# Patient Record
Sex: Male | Born: 1955 | Race: White | Hispanic: No | Marital: Married | Smoking: Former smoker
Health system: Southern US, Community
[De-identification: ages and names within clinical notes are randomized; demographics above are authoritative.]

## PROBLEM LIST (undated history)

## (undated) DIAGNOSIS — C801 Malignant (primary) neoplasm, unspecified: Secondary | ICD-10-CM

## (undated) DIAGNOSIS — I10 Essential (primary) hypertension: Secondary | ICD-10-CM

## (undated) DIAGNOSIS — Z8639 Personal history of other endocrine, nutritional and metabolic disease: Secondary | ICD-10-CM

---

## 2014-07-17 ENCOUNTER — Ambulatory Visit: Payer: Self-pay | Admitting: Physician Assistant

## 2014-07-21 ENCOUNTER — Ambulatory Visit: Payer: Self-pay | Admitting: Internal Medicine

## 2014-07-21 LAB — COMPREHENSIVE METABOLIC PANEL
ALBUMIN: 4.3 g/dL (ref 3.4–5.0)
Alkaline Phosphatase: 76 U/L
Anion Gap: 6 — ABNORMAL LOW (ref 7–16)
BILIRUBIN TOTAL: 0.7 mg/dL (ref 0.2–1.0)
BUN: 14 mg/dL (ref 7–18)
CHLORIDE: 99 mmol/L (ref 98–107)
CO2: 32 mmol/L (ref 21–32)
Calcium, Total: 9.6 mg/dL (ref 8.5–10.1)
Creatinine: 1.27 mg/dL (ref 0.60–1.30)
EGFR (Non-African Amer.): >60
Glucose: 92 mg/dL (ref 65–99)
Osmolality: 274 (ref 275–301)
Potassium: 4.9 mmol/L (ref 3.5–5.1)
SGOT(AST): 32 U/L (ref 15–37)
SGPT (ALT): 55 U/L
Sodium: 137 mmol/L (ref 136–145)
TOTAL PROTEIN: 7.9 g/dL (ref 6.4–8.2)

## 2014-07-21 LAB — CBC CANCER CENTER
BASOS ABS: 0.1 x10 3/mm (ref 0.0–0.1)
Basophil %: 0.8 %
EOS ABS: 0.2 x10 3/mm (ref 0.0–0.7)
Eosinophil %: 2.9 %
HCT: 50.3 % (ref 40.0–52.0)
HGB: 17.1 g/dL (ref 13.0–18.0)
Lymphocyte #: 2 x10 3/mm (ref 1.0–3.6)
Lymphocyte %: 29.4 %
MCH: 30.5 pg (ref 26.0–34.0)
MCHC: 33.9 g/dL (ref 32.0–36.0)
MCV: 90 fL (ref 80–100)
MONO ABS: 0.7 x10 3/mm (ref 0.2–1.0)
Monocyte %: 9.9 %
Neutrophil #: 3.9 x10 3/mm (ref 1.4–6.5)
Neutrophil %: 57 %
PLATELETS: 222 x10 3/mm (ref 150–440)
RBC: 5.61 10*6/uL (ref 4.40–5.90)
RDW: 13.2 % (ref 11.5–14.5)
WBC: 6.9 x10 3/mm (ref 3.8–10.6)

## 2014-07-21 LAB — APTT: ACTIVATED PTT: 25.9 s (ref 23.6–35.9)

## 2014-07-21 LAB — PROTIME-INR
INR: 1.2
Prothrombin Time: 15.2 secs — ABNORMAL HIGH (ref 11.5–14.7)

## 2014-07-28 ENCOUNTER — Ambulatory Visit: Payer: Self-pay | Admitting: Internal Medicine

## 2014-08-05 ENCOUNTER — Ambulatory Visit: Payer: Self-pay | Admitting: Vascular Surgery

## 2014-08-10 LAB — HEPATIC FUNCTION PANEL A (ARMC)
ALK PHOS: 77 U/L
Albumin: 4.1 g/dL (ref 3.4–5.0)
BILIRUBIN DIRECT: 0.1 mg/dL (ref 0.0–0.2)
BILIRUBIN TOTAL: 0.6 mg/dL (ref 0.2–1.0)
SGOT(AST): 22 U/L (ref 15–37)
SGPT (ALT): 44 U/L
Total Protein: 7.7 g/dL (ref 6.4–8.2)

## 2014-08-10 LAB — BASIC METABOLIC PANEL
Anion Gap: 12 (ref 7–16)
BUN: 19 mg/dL — ABNORMAL HIGH (ref 7–18)
CREATININE: 1.16 mg/dL (ref 0.60–1.30)
Calcium, Total: 9.3 mg/dL (ref 8.5–10.1)
Chloride: 100 mmol/L (ref 98–107)
Co2: 25 mmol/L (ref 21–32)
Glucose: 162 mg/dL — ABNORMAL HIGH (ref 65–99)
Osmolality: 280 (ref 275–301)
POTASSIUM: 4.3 mmol/L (ref 3.5–5.1)
Sodium: 137 mmol/L (ref 136–145)

## 2014-08-10 LAB — CBC CANCER CENTER
Basophil #: 0 x10 3/mm (ref 0.0–0.1)
Basophil %: 0.1 %
EOS ABS: 0 x10 3/mm (ref 0.0–0.7)
Eosinophil %: 0 %
HCT: 47.9 % (ref 40.0–52.0)
HGB: 16.2 g/dL (ref 13.0–18.0)
Lymphocyte #: 1 x10 3/mm (ref 1.0–3.6)
Lymphocyte %: 6.3 %
MCH: 29.7 pg (ref 26.0–34.0)
MCHC: 33.9 g/dL (ref 32.0–36.0)
MCV: 88 fL (ref 80–100)
Monocyte #: 0.3 x10 3/mm (ref 0.2–1.0)
Monocyte %: 2.1 %
Neutrophil #: 14.4 x10 3/mm — ABNORMAL HIGH (ref 1.4–6.5)
Neutrophil %: 91.5 %
Platelet: 229 x10 3/mm (ref 150–440)
RBC: 5.46 10*6/uL (ref 4.40–5.90)
RDW: 13.3 % (ref 11.5–14.5)
WBC: 15.8 x10 3/mm — ABNORMAL HIGH (ref 3.8–10.6)

## 2014-08-11 ENCOUNTER — Ambulatory Visit: Payer: Self-pay | Admitting: Internal Medicine

## 2014-08-17 LAB — CBC CANCER CENTER
BASOS PCT: 0.5 %
Basophil #: 0 x10 3/mm (ref 0.0–0.1)
Eosinophil #: 0 x10 3/mm (ref 0.0–0.7)
Eosinophil %: 2.8 %
HCT: 44.7 % (ref 40.0–52.0)
HGB: 15.1 g/dL (ref 13.0–18.0)
LYMPHS ABS: 1.1 x10 3/mm (ref 1.0–3.6)
Lymphocyte %: 90.3 %
MCH: 29.5 pg (ref 26.0–34.0)
MCHC: 33.8 g/dL (ref 32.0–36.0)
MCV: 87 fL (ref 80–100)
MONO ABS: 0.1 x10 3/mm — AB (ref 0.2–1.0)
MONOS PCT: 5.4 %
Neutrophil #: 0 x10 3/mm — ABNORMAL LOW (ref 1.4–6.5)
Neutrophil %: 1 %
Platelet: 79 x10 3/mm — ABNORMAL LOW (ref 150–440)
RBC: 5.12 10*6/uL (ref 4.40–5.90)
RDW: 12.8 % (ref 11.5–14.5)
WBC: 1.2 x10 3/mm — CL (ref 3.8–10.6)

## 2014-08-17 LAB — CREATININE, SERUM
Creatinine: 1.48 mg/dL — ABNORMAL HIGH (ref 0.60–1.30)
EGFR (Non-African Amer.): 52 — ABNORMAL LOW

## 2014-08-20 ENCOUNTER — Inpatient Hospital Stay: Payer: Self-pay | Admitting: Internal Medicine

## 2014-08-20 LAB — CBC WITH DIFFERENTIAL/PLATELET
Bands: 6 %
COMMENT - H1-COM1: NORMAL
EOS PCT: 2 %
HCT: 42.7 % (ref 40.0–52.0)
HGB: 14.2 g/dL (ref 13.0–18.0)
Lymphocytes: 45 %
MCH: 30 pg (ref 26.0–34.0)
MCHC: 33.2 g/dL (ref 32.0–36.0)
MCV: 90 fL (ref 80–100)
MONOS PCT: 10 %
Metamyelocyte: 1 %
Platelet: 54 10*3/uL — ABNORMAL LOW (ref 150–440)
RBC: 4.74 10*6/uL (ref 4.40–5.90)
RDW: 13 % (ref 11.5–14.5)
Segmented Neutrophils: 26 %
Variant Lymphocyte - H1-Rlymph: 10 %
WBC: 2.3 10*3/uL — AB (ref 3.8–10.6)

## 2014-08-20 LAB — COMPREHENSIVE METABOLIC PANEL
ALBUMIN: 3.4 g/dL (ref 3.4–5.0)
ANION GAP: 10 (ref 7–16)
AST: 20 U/L (ref 15–37)
Alkaline Phosphatase: 76 U/L
BUN: 23 mg/dL — AB (ref 7–18)
Bilirubin,Total: 0.6 mg/dL (ref 0.2–1.0)
CHLORIDE: 97 mmol/L — AB (ref 98–107)
CO2: 27 mmol/L (ref 21–32)
CREATININE: 1.53 mg/dL — AB (ref 0.60–1.30)
Calcium, Total: 8.7 mg/dL (ref 8.5–10.1)
EGFR (Non-African Amer.): 50 — ABNORMAL LOW
GLUCOSE: 85 mg/dL (ref 65–99)
Osmolality: 271 (ref 275–301)
Potassium: 3.8 mmol/L (ref 3.5–5.1)
SGPT (ALT): 22 U/L
Sodium: 134 mmol/L — ABNORMAL LOW (ref 136–145)
TOTAL PROTEIN: 7 g/dL (ref 6.4–8.2)

## 2014-08-21 LAB — BASIC METABOLIC PANEL
ANION GAP: 7 (ref 7–16)
BUN: 14 mg/dL (ref 7–18)
CHLORIDE: 109 mmol/L — AB (ref 98–107)
Calcium, Total: 7.9 mg/dL — ABNORMAL LOW (ref 8.5–10.1)
Co2: 24 mmol/L (ref 21–32)
Creatinine: 1.22 mg/dL (ref 0.60–1.30)
EGFR (African American): >60
EGFR (Non-African Amer.): >60
Glucose: 91 mg/dL (ref 65–99)
Osmolality: 279 (ref 275–301)
Potassium: 4 mmol/L (ref 3.5–5.1)
Sodium: 140 mmol/L (ref 136–145)

## 2014-08-21 LAB — CBC WITH DIFFERENTIAL/PLATELET
Basophil #: 0 10*3/uL (ref 0.0–0.1)
Basophil %: 0.5 %
Eosinophil #: 0 10*3/uL (ref 0.0–0.7)
Eosinophil %: 0.6 %
HCT: 38.1 % — AB (ref 40.0–52.0)
HGB: 12.7 g/dL — ABNORMAL LOW (ref 13.0–18.0)
LYMPHS PCT: 29.1 %
Lymphocyte #: 1 10*3/uL (ref 1.0–3.6)
MCH: 30.3 pg (ref 26.0–34.0)
MCHC: 33.3 g/dL (ref 32.0–36.0)
MCV: 91 fL (ref 80–100)
MONOS PCT: 10.8 %
Monocyte #: 0.4 x10 3/mm (ref 0.2–1.0)
NEUTROS ABS: 2.1 10*3/uL (ref 1.4–6.5)
Neutrophil %: 59 %
Platelet: 66 10*3/uL — ABNORMAL LOW (ref 150–440)
RBC: 4.2 10*6/uL — ABNORMAL LOW (ref 4.40–5.90)
RDW: 12.8 % (ref 11.5–14.5)
WBC: 3.6 10*3/uL — ABNORMAL LOW (ref 3.8–10.6)

## 2014-08-24 LAB — CULTURE, BLOOD (SINGLE)

## 2014-08-25 LAB — CBC CANCER CENTER
BASOS ABS: 0.1 x10 3/mm (ref 0.0–0.1)
Basophil %: 0.4 %
EOS ABS: 0 x10 3/mm (ref 0.0–0.7)
Eosinophil %: 0.3 %
HCT: 42.2 % (ref 40.0–52.0)
HGB: 14.1 g/dL (ref 13.0–18.0)
Lymphocyte #: 2.2 x10 3/mm (ref 1.0–3.6)
Lymphocyte %: 13.1 %
MCH: 29.3 pg (ref 26.0–34.0)
MCHC: 33.3 g/dL (ref 32.0–36.0)
MCV: 88 fL (ref 80–100)
MONOS PCT: 6.2 %
Monocyte #: 1 x10 3/mm (ref 0.2–1.0)
NEUTROS PCT: 80 %
Neutrophil #: 13.4 x10 3/mm — ABNORMAL HIGH (ref 1.4–6.5)
Platelet: 213 x10 3/mm (ref 150–440)
RBC: 4.8 10*6/uL (ref 4.40–5.90)
RDW: 13 % (ref 11.5–14.5)
WBC: 16.8 x10 3/mm — AB (ref 3.8–10.6)

## 2014-08-25 LAB — HEPATIC FUNCTION PANEL A (ARMC)
Albumin: 3.6 g/dL (ref 3.4–5.0)
Alkaline Phosphatase: 86 U/L
Bilirubin, Direct: 0.1 mg/dL (ref 0.0–0.2)
Bilirubin,Total: 0.2 mg/dL (ref 0.2–1.0)
SGOT(AST): 22 U/L (ref 15–37)
SGPT (ALT): 31 U/L
Total Protein: 6.4 g/dL (ref 6.4–8.2)

## 2014-08-25 LAB — BASIC METABOLIC PANEL
Anion Gap: 11 (ref 7–16)
BUN: 17 mg/dL (ref 7–18)
Calcium, Total: 8.1 mg/dL — ABNORMAL LOW (ref 8.5–10.1)
Chloride: 103 mmol/L (ref 98–107)
Co2: 25 mmol/L (ref 21–32)
Creatinine: 1.44 mg/dL — ABNORMAL HIGH (ref 0.60–1.30)
EGFR (African American): >60
EGFR (Non-African Amer.): 54 — ABNORMAL LOW
Glucose: 84 mg/dL (ref 65–99)
Osmolality: 278 (ref 275–301)
Potassium: 3.7 mmol/L (ref 3.5–5.1)
Sodium: 139 mmol/L (ref 136–145)

## 2014-08-25 LAB — MAGNESIUM: MAGNESIUM: 1.5 mg/dL — AB

## 2014-09-07 LAB — CBC CANCER CENTER
Basophil #: 0 x10 3/mm (ref 0.0–0.1)
Basophil %: 0.2 %
Eosinophil #: 0 x10 3/mm (ref 0.0–0.7)
Eosinophil %: 0 %
HCT: 41.9 % (ref 40.0–52.0)
HGB: 13.7 g/dL (ref 13.0–18.0)
LYMPHS ABS: 1.2 x10 3/mm (ref 1.0–3.6)
Lymphocyte %: 5.3 %
MCH: 29.3 pg (ref 26.0–34.0)
MCHC: 32.8 g/dL (ref 32.0–36.0)
MCV: 89 fL (ref 80–100)
Monocyte #: 0.6 x10 3/mm (ref 0.2–1.0)
Monocyte %: 2.6 %
NEUTROS ABS: 21.2 x10 3/mm — AB (ref 1.4–6.5)
NEUTROS PCT: 91.9 %
PLATELETS: 405 x10 3/mm (ref 150–440)
RBC: 4.69 10*6/uL (ref 4.40–5.90)
RDW: 14.1 % (ref 11.5–14.5)
WBC: 23.1 x10 3/mm — ABNORMAL HIGH (ref 3.8–10.6)

## 2014-09-07 LAB — BASIC METABOLIC PANEL
ANION GAP: 12 (ref 7–16)
BUN: 17 mg/dL (ref 7–18)
CO2: 24 mmol/L (ref 21–32)
Calcium, Total: 8.8 mg/dL (ref 8.5–10.1)
Chloride: 97 mmol/L — ABNORMAL LOW (ref 98–107)
Creatinine: 1.33 mg/dL — ABNORMAL HIGH (ref 0.60–1.30)
EGFR (African American): >60
EGFR (Non-African Amer.): 59 — ABNORMAL LOW
GLUCOSE: 208 mg/dL — AB (ref 65–99)
Osmolality: 274 (ref 275–301)
Potassium: 4.6 mmol/L (ref 3.5–5.1)
SODIUM: 133 mmol/L — AB (ref 136–145)

## 2014-09-11 ENCOUNTER — Ambulatory Visit: Payer: Self-pay | Admitting: Internal Medicine

## 2014-09-14 LAB — CBC CANCER CENTER
BASOS ABS: 0 x10 3/mm (ref 0.0–0.1)
BASOS PCT: 0.9 %
Eosinophil #: 0.3 x10 3/mm (ref 0.0–0.7)
Eosinophil %: 10.4 %
HCT: 41.2 % (ref 40.0–52.0)
HGB: 13.8 g/dL (ref 13.0–18.0)
LYMPHS ABS: 1.4 x10 3/mm (ref 1.0–3.6)
Lymphocyte %: 51.5 %
MCH: 29.3 pg (ref 26.0–34.0)
MCHC: 33.4 g/dL (ref 32.0–36.0)
MCV: 88 fL (ref 80–100)
MONO ABS: 0.5 x10 3/mm (ref 0.2–1.0)
Monocyte %: 18.3 %
NEUTROS ABS: 0.5 x10 3/mm — AB (ref 1.4–6.5)
Neutrophil %: 18.9 %
Platelet: 112 x10 3/mm — ABNORMAL LOW (ref 150–440)
RBC: 4.69 10*6/uL (ref 4.40–5.90)
RDW: 13.7 % (ref 11.5–14.5)
WBC: 2.8 x10 3/mm — ABNORMAL LOW (ref 3.8–10.6)

## 2014-09-14 LAB — CREATININE, SERUM
CREATININE: 1.27 mg/dL (ref 0.60–1.30)
EGFR (African American): >60

## 2014-09-21 LAB — HEPATIC FUNCTION PANEL A (ARMC)
Albumin: 3.5 g/dL (ref 3.4–5.0)
Alkaline Phosphatase: 104 U/L
Bilirubin,Total: 0.3 mg/dL (ref 0.2–1.0)
SGOT(AST): 34 U/L (ref 15–37)
SGPT (ALT): 36 U/L
Total Protein: 6.3 g/dL — ABNORMAL LOW (ref 6.4–8.2)

## 2014-09-21 LAB — CBC CANCER CENTER
BASOS ABS: 0.1 x10 3/mm (ref 0.0–0.1)
BASOS PCT: 0.9 %
Eosinophil #: 0 x10 3/mm (ref 0.0–0.7)
Eosinophil %: 0.3 %
HCT: 42.9 % (ref 40.0–52.0)
HGB: 13.9 g/dL (ref 13.0–18.0)
LYMPHS PCT: 11.5 %
Lymphocyte #: 1.7 x10 3/mm (ref 1.0–3.6)
MCH: 29.8 pg (ref 26.0–34.0)
MCHC: 32.4 g/dL (ref 32.0–36.0)
MCV: 92 fL (ref 80–100)
MONOS PCT: 5 %
Monocyte #: 0.7 x10 3/mm (ref 0.2–1.0)
Neutrophil #: 11.9 x10 3/mm — ABNORMAL HIGH (ref 1.4–6.5)
Neutrophil %: 82.3 %
Platelet: 95 x10 3/mm — ABNORMAL LOW (ref 150–440)
RBC: 4.67 10*6/uL (ref 4.40–5.90)
RDW: 14.7 % — AB (ref 11.5–14.5)
WBC: 14.5 x10 3/mm — ABNORMAL HIGH (ref 3.8–10.6)

## 2014-09-21 LAB — BASIC METABOLIC PANEL
Anion Gap: 7 (ref 7–16)
BUN: 17 mg/dL (ref 7–18)
Calcium, Total: 8.6 mg/dL (ref 8.5–10.1)
Chloride: 105 mmol/L (ref 98–107)
Co2: 26 mmol/L (ref 21–32)
Creatinine: 1.11 mg/dL (ref 0.60–1.30)
EGFR (African American): >60
EGFR (Non-African Amer.): >60
Glucose: 101 mg/dL — ABNORMAL HIGH (ref 65–99)
Osmolality: 277 (ref 275–301)
Potassium: 4.8 mmol/L (ref 3.5–5.1)
Sodium: 138 mmol/L (ref 136–145)

## 2014-09-21 LAB — MAGNESIUM: Magnesium: 1.5 mg/dL — ABNORMAL LOW

## 2014-09-28 LAB — BASIC METABOLIC PANEL
ANION GAP: 10 (ref 7–16)
BUN: 22 mg/dL — AB (ref 7–18)
Calcium, Total: 8.6 mg/dL (ref 8.5–10.1)
Chloride: 100 mmol/L (ref 98–107)
Co2: 24 mmol/L (ref 21–32)
Creatinine: 1.24 mg/dL (ref 0.60–1.30)
EGFR (African American): >60
EGFR (Non-African Amer.): >60
Glucose: 127 mg/dL — ABNORMAL HIGH (ref 65–99)
Osmolality: 273 (ref 275–301)
Potassium: 4.3 mmol/L (ref 3.5–5.1)
SODIUM: 134 mmol/L — AB (ref 136–145)

## 2014-09-28 LAB — CBC CANCER CENTER
BASOS ABS: 0.1 x10 3/mm (ref 0.0–0.1)
BASOS PCT: 0.3 %
EOS ABS: 0 x10 3/mm (ref 0.0–0.7)
Eosinophil %: 0 %
HCT: 39.7 % — ABNORMAL LOW (ref 40.0–52.0)
HGB: 13 g/dL (ref 13.0–18.0)
LYMPHS PCT: 7.4 %
Lymphocyte #: 1.5 x10 3/mm (ref 1.0–3.6)
MCH: 29.4 pg (ref 26.0–34.0)
MCHC: 32.7 g/dL (ref 32.0–36.0)
MCV: 90 fL (ref 80–100)
Monocyte #: 0.9 x10 3/mm (ref 0.2–1.0)
Monocyte %: 4.4 %
Neutrophil #: 17.6 x10 3/mm — ABNORMAL HIGH (ref 1.4–6.5)
Neutrophil %: 87.9 %
Platelet: 238 x10 3/mm (ref 150–440)
RBC: 4.41 10*6/uL (ref 4.40–5.90)
RDW: 15.2 % — ABNORMAL HIGH (ref 11.5–14.5)
WBC: 20 x10 3/mm — AB (ref 3.8–10.6)

## 2014-10-05 LAB — COMPREHENSIVE METABOLIC PANEL
ALBUMIN: 3.7 g/dL (ref 3.4–5.0)
ALT: 25 U/L
AST: 13 U/L — AB (ref 15–37)
Alkaline Phosphatase: 74 U/L
Anion Gap: 9 (ref 7–16)
BILIRUBIN TOTAL: 0.7 mg/dL (ref 0.2–1.0)
BUN: 25 mg/dL — ABNORMAL HIGH (ref 7–18)
CREATININE: 1.36 mg/dL — AB (ref 0.60–1.30)
Calcium, Total: 8 mg/dL — ABNORMAL LOW (ref 8.5–10.1)
Chloride: 96 mmol/L — ABNORMAL LOW (ref 98–107)
Co2: 30 mmol/L (ref 21–32)
EGFR (African American): >60
EGFR (Non-African Amer.): 57 — ABNORMAL LOW
GLUCOSE: 96 mg/dL (ref 65–99)
Osmolality: 274 (ref 275–301)
Potassium: 3.5 mmol/L (ref 3.5–5.1)
Sodium: 135 mmol/L — ABNORMAL LOW (ref 136–145)
TOTAL PROTEIN: 6.4 g/dL (ref 6.4–8.2)

## 2014-10-05 LAB — CBC CANCER CENTER
Basophil #: 0 x10 3/mm (ref 0.0–0.1)
Basophil %: 1.1 %
Eosinophil #: 0.1 x10 3/mm (ref 0.0–0.7)
Eosinophil %: 3.8 %
HCT: 38.3 % — ABNORMAL LOW (ref 40.0–52.0)
HGB: 12.8 g/dL — ABNORMAL LOW (ref 13.0–18.0)
Lymphocyte #: 1.3 x10 3/mm (ref 1.0–3.6)
Lymphocyte %: 52.9 %
MCH: 29.7 pg (ref 26.0–34.0)
MCHC: 33.4 g/dL (ref 32.0–36.0)
MCV: 89 fL (ref 80–100)
Monocyte #: 0.3 x10 3/mm (ref 0.2–1.0)
Monocyte %: 13.2 %
NEUTROS ABS: 0.7 x10 3/mm — AB (ref 1.4–6.5)
NEUTROS PCT: 29 %
Platelet: 80 x10 3/mm — ABNORMAL LOW (ref 150–440)
RBC: 4.31 10*6/uL — ABNORMAL LOW (ref 4.40–5.90)
RDW: 15.1 % — AB (ref 11.5–14.5)
WBC: 2.4 x10 3/mm — ABNORMAL LOW (ref 3.8–10.6)

## 2014-10-12 ENCOUNTER — Ambulatory Visit: Payer: Self-pay | Admitting: Internal Medicine

## 2014-10-12 LAB — CBC CANCER CENTER
Basophil #: 0.1 x10 3/mm (ref 0.0–0.1)
Basophil %: 0.8 %
EOS ABS: 0 x10 3/mm (ref 0.0–0.7)
Eosinophil %: 0.5 %
HCT: 39 % — ABNORMAL LOW (ref 40.0–52.0)
HGB: 13.1 g/dL (ref 13.0–18.0)
Lymphocyte #: 1.3 x10 3/mm (ref 1.0–3.6)
Lymphocyte %: 18.4 %
MCH: 30.7 pg (ref 26.0–34.0)
MCHC: 33.6 g/dL (ref 32.0–36.0)
MCV: 91 fL (ref 80–100)
MONO ABS: 0.3 x10 3/mm (ref 0.2–1.0)
Monocyte %: 4.8 %
NEUTROS PCT: 75.5 %
Neutrophil #: 5.2 x10 3/mm (ref 1.4–6.5)
PLATELETS: 105 x10 3/mm — AB (ref 150–440)
RBC: 4.27 10*6/uL — ABNORMAL LOW (ref 4.40–5.90)
RDW: 16.5 % — AB (ref 11.5–14.5)
WBC: 6.9 x10 3/mm (ref 3.8–10.6)

## 2014-10-12 LAB — COMPREHENSIVE METABOLIC PANEL
ANION GAP: 9 (ref 7–16)
AST: 15 U/L (ref 15–37)
Albumin: 3.5 g/dL (ref 3.4–5.0)
Alkaline Phosphatase: 86 U/L (ref 46–116)
BUN: 11 mg/dL (ref 7–18)
Bilirubin,Total: 0.3 mg/dL (ref 0.2–1.0)
CHLORIDE: 102 mmol/L (ref 98–107)
Calcium, Total: 8.4 mg/dL — ABNORMAL LOW (ref 8.5–10.1)
Co2: 28 mmol/L (ref 21–32)
Creatinine: 1.13 mg/dL (ref 0.60–1.30)
EGFR (Non-African Amer.): >60
Glucose: 115 mg/dL — ABNORMAL HIGH (ref 65–99)
Osmolality: 278 (ref 275–301)
Potassium: 4.4 mmol/L (ref 3.5–5.1)
SGPT (ALT): 23 U/L (ref 14–63)
Sodium: 139 mmol/L (ref 136–145)
TOTAL PROTEIN: 5.9 g/dL — AB (ref 6.4–8.2)

## 2014-11-10 ENCOUNTER — Ambulatory Visit
Admit: 2014-11-10 | Disposition: A | Discharge status: Home / Self Care | Payer: Self-pay | Attending: Internal Medicine | Admitting: Internal Medicine

## 2014-12-09 LAB — CBC CANCER CENTER
Basophil #: 0 x10 3/mm (ref 0.0–0.1)
Basophil %: 0.4 %
EOS ABS: 0.1 x10 3/mm (ref 0.0–0.7)
EOS PCT: 3.5 %
HCT: 39.4 % — AB (ref 40.0–52.0)
HGB: 13.4 g/dL (ref 13.0–18.0)
LYMPHS ABS: 0.6 x10 3/mm — AB (ref 1.0–3.6)
Lymphocyte %: 18.2 %
MCH: 30.6 pg (ref 26.0–34.0)
MCHC: 34.1 g/dL (ref 32.0–36.0)
MCV: 90 fL (ref 80–100)
MONOS PCT: 12.4 %
Monocyte #: 0.4 x10 3/mm (ref 0.2–1.0)
NEUTROS ABS: 2.1 x10 3/mm (ref 1.4–6.5)
Neutrophil %: 65.5 %
Platelet: 136 x10 3/mm — ABNORMAL LOW (ref 150–440)
RBC: 4.39 10*6/uL — AB (ref 4.40–5.90)
RDW: 13.7 % (ref 11.5–14.5)
WBC: 3.2 x10 3/mm — AB (ref 3.8–10.6)

## 2014-12-11 ENCOUNTER — Ambulatory Visit
Admit: 2014-12-11 | Disposition: A | Discharge status: Home / Self Care | Payer: Self-pay | Attending: Internal Medicine | Admitting: Internal Medicine

## 2015-01-02 NOTE — Discharge Summary (Signed)
PATIENT NAME:  Bryan Larson, Bryan Larson MR#:  [id number] DATE OF BIRTH:  1956-04-28  DATE OF ADMISSION:  08/20/2014 DATE OF DISCHARGE:  08/21/2014  PRIMARY ONCOLOGIST: Rhett Bannister. Ma Hillock, MD.  ADMITTING DIAGNOSES:  1. Dehydration.  2. Acute kidney injury.  3. Oral thrush and mucositis. 4. Chronic right tonsillar carcinoma.  5. Leukopenia.   DISCHARGE DIAGNOSES: 1. Dehydration is improved with intravenous fluids. 2. Acute kidney injury is resolved with intravenous fluids.  3. Oral thrush. Continue nystatin swish and swallow.  4. Chronic right tonsillar carcinoma Follow up with Dr. Ma Hillock as scheduled on 12/21.  5. Leukopenia, better. 6. Thrombocytopenia.  7. Hypotension. We will hold off on Diovan until tomorrow and resume spironolactone.  8. Paroxysmal atrial fibrillation, rate controlled. Continue atenolol. Not on any anticoagulants.   CONSULTATIONS: None.   PROCEDURES: None.   BRIEF HISTORY AND PHYSICAL AND HOSPITAL COURSE: The patient is a 59 year old Caucasian male who came into the ED as he was having a hard time to swallow with decreased p.o. intake from oral ulcers. The patient's blood pressure was at 104/80 in the ED. Creatinine was at 1.55, and the white blood cell count was at 2.3. Please review history and physical for details.   HOSPITAL COURSE: 1. The patient's dehydration seemed to be from poor p.o. intake from oral ulcers. He was given IV fluids. Dehydration was resolved. The patient was maintained on neutropenia, isolation precautions as his white count is low.  2. Acute kidney injury from dehydration, which was prerenal, improved with IV fluids.  3. Oral thrush with mucositis. The patient has right tonsillar squamous cell carcinoma, and his first dose of chemotherapy was on November 30. Thrush and mucositis seemed to be from chemotherapy. The patient was given nystatin swish and swallow and Magic mouthwash, and his clinical situation significantly improved, and he started  tolerating diet. He denies any pain when swallowing, clinically feeling better. His next appointment with Dr. Ma Hillock is on December 21.  4. Leukopenia. White count was 2.3 at the time of admission: Today it is better at 3.6. The patient was afebrile during the hospital course. The primary care physician and oncologist to monitor his white count. At this point, his white count is nicely improving. 5. Right tonsillar carcinoma. The patient is placed on prophylaxis with ciprofloxacin by Dr. Ma Hillock. The patient has to continue that.  6. Thrombocytopenia. Initial platelet count was at around 55,000 but today it is at 66,000. No bleeding diaphysis was noticed. His platelet count needs to be monitored closely.  7. Hypotension. Blood pressure is being low. Spironolactone and Diovan were held during the hospital course. Today, blood pressure is better. We will resume his spironolactone and recommended the patient to start taking Diovan from tomorrow.  8. History of paroxysmal atrial fibrillation, rate controlled, not on any anticoagulation. Continue atenolol.  9. Hyperlipidemia. Continue Simvastatin.   ACTIVITY: As tolerated.  PHYSICAL EXAMINATION: VITAL SIGNS: Today, temperature 98.4, pulse of 106, respirations 20, blood pressure 109/74, pulse oximetry 98% on room air.  GENERAL APPEARANCE: Not in acute distress. Moderately built and nourished.  HEENT: Normocephalic, atraumatic. Pupils are equally reactive to light and accommodation. No scleral icterus. No conjunctival injection. No sinus tenderness. No postnasal drip. Moist mucous membranes. Moist mucous membranes. Oropharynx with white patches. NECK: Supple. No JVD. No thyromegaly. Range of motion is intact.  LUNGS: Clear to auscultation bilaterally. No accessory muscle use and no anterior chest wall tenderness on palpation.  CARDIOVASCULAR: Irregularly irregular. No murmurs.  GASTROINTESTINAL: Soft. Bowel  sounds are positive in all 4 quadrants. Nontender,  nondistended. No hepatosplenomegaly. No masses felt.  NEUROLOGIC: Awake, alert and oriented x 3. Cranial nerves II through XII are grossly intact. Motor and sensory are intact. Reflexes 2+.  EXTREMITIES: No edema. No cyanosis. No clubbing.  PSYCHIATRIC: Awake, alert and oriented x 3. Normal mood and normal.   SIGNIFICANT LABORATORIES AND IMAGING STUDIES: The patient's creatinine at the time of admission is 1.53. Today it is at 1.22. BUN is normal. Sodium and potassium are normal. Anion gap is 7. GFR is greater than 60. LFTs are normal. WBC 3.6, hemoglobin 12.7, hematocrit 38.1, platelet count trended up from 54,000 to 66,000. Blood cultures have revealed no growth in 24 hours.  FOLLOWUP APPOINTMENTS: Follow up with primary care physician in a week, Dr. Ma Hillock as scheduled on December 21.  DIET: Regular diet. Supplement with Ensure Plus as recommended by dietitian.  MEDICATIONS AT THE TIME OF DISCHARGE: Diovan 40 mg p.o. once daily, start taking from tomorrow, 08/22/2014. Atenolol 100 mg p.o. once daily, spironolactone 25 mg p.o. once daily, Simvastatin 40 mg p.o. at bedtime, Compazine 10 mg 1 tablet p.o. every 6 hours as needed for nausea, ciprofloxacin 500 mg p.o. b.i.d. as recommended by Dr. Ma Hillock for prophylaxis as reported by the patient, Nexium 40 mg once daily, lidocaine topical gel apply to affected area once daily, nystatin swish and swallow 100,000 units per mL take 5 mL p.o. every 6 hours for 1 week, Magic mouthwash 10 mL p.o. every 6 hours as needed for one week, Tylenol 325 mg 2 tablets every 4 hours as needed for mild pain or temperature greater than 100.4, Ensure Clear 200 mL p.o. b.i.d. for a week, senna 1 tablet p.o. 2 times a day as needed for constipation.  The diagnosis and plan of care were discussed in detail with the patient. He verbalized understanding of the plan.    ___________________________ Nicholes Mango, MD ag:jh D: 08/21/2014 11:34:53 ET T: 08/21/2014 19:12:00  ET JOB#: [id number]  cc: Nicholes Mango, MD, <Dictator> Miriam "Mimi" Carrie Mew, PA-C Sandeep R. Ma Hillock, MD Nicholes Mango MD ELECTRONICALLY SIGNED 08/27/2014 17:04

## 2015-01-02 NOTE — H&P (Signed)
PATIENT NAME:  Bryan Larson, Bryan Larson MR#:  [id number] DATE OF BIRTH:  June 12, 1956  DATE OF ADMISSION:  08/20/2014  REFERRING PHYSICIAN: Valli Glance. Owens Shark, MD   PRIMARY CARE PROVIDER: Prentiss Bells "Mimi" Carrie Mew, PA-C  ADMITTING PHYSICIAN: Juluis Mire, MD   ONCOLOGIST: Rhett Bannister Ma Hillock, MD    CHIEF COMPLAINT:  1.  Decreased oral intake because of oral ulcers for the past 2-3 days.   2.  Low blood pressure on home monitoring.    HISTORY OF PRESENT ILLNESS: A 59 year old Caucasian male with past medical history significant for right tonsillar carcinoma diagnosed on 07/17/2014 recently started on chemotherapy on 08/10/2014 was brought to the Emergency Room with the complaints of  noticed low blood pressure on monitoring and has been having decreased oral intake for the past 2-3 days secondary due to oral ulcers. 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 He is currently under care of oncologist, Dr. Ma Hillock. He received  first chemotherapy on 08/10/2014 following which his  white count was very low and he is on neutropenic precautions at home. He was advised to monitor his blood pressure and temperature daily at home, and today, while he was having his blood pressure monitoring, he was noted to have a low blood pressure 85/65 at home but did not have any other complaints such as dizziness, fever, no chest pain. The patient admits to decreased oral intake for the past one week secondary to oral ulcers . Denies any fever. No chest pain. No shortness of breath. No nausea. No vomiting. No diarrhea. No abdominal pain. No urinary symptoms. No focal weakness or numbness. In the Emergency Room, the patient was evaluated by the ED physician and the blood pressure was 104/80 and workup revealed acute kidney injury with a creatinine of 1.55 and also his white blood cell was low which is 2.3, which is slightly  improved compared to the previous labs which were done a couple of days ago. The patient was given IV fluids in view of low blood pressure and tachycardia, following which his blood pressure improved and currently his blood pressure is 110/82. The patient denies any complaints at this time, except for oral soreness secondary due to ulcer. The patient also noticed some rash over the forehead which started last evening and complains of some mild itching.    PAST MEDICAL HISTORY:  1.  Hypertension, hyperlipidemia and paroxysmal atrial fibrillation.  2.  The right tonsillar carcinoma, which is poorly differentiated squamous cell carcinoma recently diagnosed on 07/17/2014 and currently on chemotherapy and neutropenic precautions.   PAST SURGICAL HISTORY: Right hand surgery.   ALLERGIES: No known drug allergies.   SOCIAL HISTORY: He is married and lives with his wife. He is an ex-smoker, quit about 9 years ago. He drinks alcohol socially about 2 beers per week. No history of any substance abuse.   FAMILY HISTORY: Positive for hypertension, heart disease, cerebrovascular accident and cancer.   HOME MEDICATIONS:  1.  Aspirin 81 mg p.o. daily.  2.  Diovan 320 mg 1 tablet daily.  3.  Atenolol 100 mg orally 1 tablet a daily.  4.  Spironolactone 25 mg tablet 1 tablet orally daily.  5.  Simvastatin 40 mg 1 tablet orally daily.  6.  Compazine as needed for nausea.  7.  Magic mouth wash every 6 hours for oral ulcers.    REVIEW OF SYSTEMS:  CONSTITUTIONAL: Negative for fever, fatigue, generalized  weakness. He does have pain in the oral cavity:   EYES: Negative for blurred vision or double vision. No pain. No redness. No inflammation.  EAR, NOSE, AND THROAT: Negative for any tinnitus, ear pain, hearing loss. No epistaxis. No nasal discharge. He does have difficulty in swallowing with poor oral intake for the past one week secondary due to oral ulcers.   RESPIRATORY: Negative for cough, wheezing,  hemoptysis, dyspnea, painful respirations.  CARDIOVASCULAR: Negative for chest pain, orthopnea, pedal edema, dyspnea on exertion. No palpitations. No dizziness. No syncope.  GASTROINTESTINAL: Negative for nausea, vomiting, diarrhea, abdominal pain, hematemesis, melena, GERD symptoms, rectal bleeding.  GENITOURINARY: Negative for dysuria, hematuria, frequency, or urgency.  ENDOCRINE: Negative for polyuria. No nocturia. No heat or cold intolerance.  HEMATOLOGIC AND LYMPHATIC: Negative for anemia, easy bruising or bleeding.  Neutropenia secondary due to recent chemotherapy and patient is currently neutropenic isolation precautions.   INTEGUMENTARY: Noticed rash over the forehead last evening with mild itching. He has oral mucosal sores with ulcers in oral cavity as mentioned in the history of present illness.  MUSCULOSKELETAL: Negative for arthritis, joint swelling, gout.   NEUROLOGICAL: Negative for focal weakness or numbness. No history of CVA, TIA, or seizure disorder.  PSYCHIATRIC: Negative for anxiety, insomnia, depression.  PHYSICAL EXAMINATION:  VITAL SIGNS: In the Emergency Room, temperature 98.3 degrees Fahrenheit, pulse rate 106 per minute, respirations 30 per minute, blood pressure 104/73, oxygen saturation 97% on room air.  GENERAL: Well-developed, well nourished, pleasant and cooperate, alert in no acute distress.   HEAD: Atraumatic, normocephalic.  EYES: Pupils are equal and reactive to light and accommodation. No conjunctival pallor. No scleral icterus. Extraocular movements intact.  NOSE: No nasal lesions. No drainage.  EARS: No drainage. No external lesions.  ORAL CAVITY: Does have some ulcers in the posterior pharynx on the right side covered with a whitish plaque, which is oral thrush. He does have diffuse erythema on the posterior pharynx. NECK: Supple. Right cervical lymphadenopathy positive present. No thyromegaly. No JVD. Range of motion of neck is normal. RESPIRATORY: Good  respiratory effort. Not using accessory muscles of respiration. Bilateral vesicular breath sounds present. No rales or rhonchi.  CARDIOVASCULAR: S1, S2 regular. No murmurs appreciated. No gallops. No clicks. Peripheral pulses equal at carotid, femoral, and pedal pulses. No peripheral edema.   GASTROINTESTINAL: Abdomen is soft, nontender. No hepatosplenomegaly. Bowel sounds present and equal in all 4 quadrants. No guarding noted. No tenderness.  SKIN: Mild maculopapular erythematous rash over the forehead present.  GENITOURINARY: Deferred.  MUSCULOSKELETAL: Range of motion adequate in all areas. Strength and tone are equal bilaterally.  LYMPHATIC: Positive for cervical lymphadenopathy the right side, which is fixed and nontender.  VASCULAR: Good dorsalis pedis and posterior tibial pulses.  NEUROLOGICAL: Alert, awake, and oriented x 3. Cranial nerves II-XII grossly intact. DTRs  2+ bilateral and symmetrical in both upper and lower extremities. Motor strength is 5/5 in both upper and lower extremities.   PSYCHIATRIC: Judgment and insight are adequate. Alert and oriented x 3. Mood within normal limits.   LABORATORY DATA: Serum glucose 85, BUN 23, creatinine 1.53, serum sodium 134, potassium 3.8, chloride 97, bicarbonate 27, estimated GFR 50, serum calcium 8.7, total protein 7.0, serum albumin 3.4, total bilirubin 0.6, alkaline phosphatase 76, AST 20, ALT is 22. WBC 2.3, hemoglobin 14.2, hematocrit 42.7, platelet count 54,000.   ASSESSMENT AND PLAN: A 59 year old Caucasian male with a past medical history significant for hypertension, hyperlipidemia, history of paroxysmal atrial fibrillation recently diagnosed with  right tonsillar carcinoma, which is poorly differentiated squamous cell carcinoma, which was diagnosed on 07/17/2014, recently started on chemotherapy the first cycle being given on 08/10/2014, presents to the Emergency Room with the complaints of low blood pressure and decreased oral intake  secondary due to painful ulcers in oral cavity. Evaluation in the Emergency Room revealed dehydration with acute kidney injury.  1.  Dehydration secondary to decreased oral intake secondary to oral ulcers.  PLAN: Admit to medical surgery unit. Intravenous fluids, monitor blood pressure and vital signs and follow up labs. Neutropenic isolation precautions.   2.  Acute kidney injury secondary due to dehydration because of decreased oral intake secondary due to oral ulcers.  PLAN: Intravenous hydration. Follow BMP. Monitor clinically. Avoid nephrotoxic agents.  3.  Orally thrush and mucositis. Recent chemotherapy administered 08/10/2014.  PLAN: Nystatin swish and swallow every 6 hours. Magic mouthwash q.6 hours. Soft diet. Intravenous fluids.  4.  Right tonsillar carcinoma, which is poorly differentiated squamous cell carcinoma diagnosed on 07/17/2014. Received first dose of chemotherapy on 08/10/2014. The patient is under care of oncologist. Continue care per oncology.  5.  Leukopenia with a white blood cell count of 2.3. This was following recent chemotherapy administered on 08/10/2014. WBC on December 7 was 1.2. WBC improving.  PLAN: Neutropenic isolation precautions. Continue Cipro as ordered by oncologist. Follow up CBC.  6.  Thrombocytopenia secondary to recent chemotherapy administration.  No bleeding episodes. PLAN: Monitor for any bleeding. Monitor blood counts.  7.  History of hypertension. The patient on multiple medications. We hold off Diovan and spironolactone because of low blood pressure secondary to poor oral intake and acute kidney injury. Monitor blood pressure and restart blood pressure medications as needed.  8.  History of paroxysmal atrial fibrillation currently in sinus rhythm with rate control. The patient is on atenolol 100 mg p.o. daily. PLAN: Continue atenolol p.o. daily, monitor blood pressure and heart rate.  9.  History of hyperlipidemia on simvastatin 40 mg p.o. daily. PLAN:  Continue same.  10.  Deep vein thrombosis prophylaxis. Lovenox.  11.  Gastrointestinal prophylaxis with proton pump inhibitor.   CODE STATUS: Full code.   TIME SPENT: 50 minutes.    ____________________________ Juluis Mire, MD enr:bm D: 08/20/2014 01:44:12 ET T: 08/20/2014 02:15:27 ET JOB#: [id number]  cc: Juluis Mire, MD, <Dictator> [id number]Mimi" [place], PA-C Sandeep R. Ma Hillock, MD Juluis Mire MD ELECTRONICALLY SIGNED 08/20/2014 20:32

## 2015-01-02 NOTE — Op Note (Signed)
PATIENT NAME:  Bryan Larson, Bryan Larson MR#:  [id number] DATE OF BIRTH:  18-Mar-1956  DATE OF PROCEDURE:  08/05/2014  PREOPERATIVE DIAGNOSIS:  Tonsillar cancer.   POSTOPERATIVE DIAGNOSIS:  Tonsillar cancer.  PROCEDURES:  1.  Ultrasound guidance for vascular access, right internal jugular vein.  2.  Fluoroscopic guidance for placement of catheter.  3.  Placement of CT compatible Port-A-Cath, right internal jugular vein.   SURGEON:  Algernon Huxley, MD.  ANESTHESIA:  Local with moderate conscious sedation.   FLUOROSCOPY TIME:  Less than 1 minute.   CONTRAST:  Zero.   ESTIMATED BLOOD LOSS: 25 mL.  INDICATION FOR PROCEDURE:  This is a 59 year old gentleman with tonsillar cancer who needs a Port-A-Cath for chemotherapy and durable venous access.  Risks and benefits were discussed. Informed consent was obtained.   DESCRIPTION OF THE PROCEDURE:  The patient was brought to the vascular and interventional radiology suite. The right neck and chest were sterilely prepped and draped, and a sterile surgical field was created. Ultrasound was used to help visualize a patent right internal jugular vein. This was then accessed under direct ultrasound guidance without difficulty with a Seldinger needle and a permanent image was recorded. A J-wire was placed. After skin nick and dilatation, the peel-away sheath was then placed over the wire. I then anesthetized an area under the clavicle approximately 2 fingerbreadths. A transverse incision was created and an inferior pocket was created with electrocautery and blunt dissection. The port was then brought onto the field, placed into the pocket and secured to the chest wall with 2 Prolene sutures. The catheter was connected to the port and tunneled from the subclavicular incision to the access site. Fluoroscopic guidance was used to cut the catheter to an appropriate length. The catheter was then placed through the peel-away sheath and the peel-away sheath was removed. The  catheter tip was parked in excellent location in the cavoatrial junction area.  The pocket was then irrigated with antibiotic-impregnated saline and the wound was closed with a running 3-0 Vicryl and a 4-0 Monocryl. The access incision was closed with a single 4-0 Monocryl. The Huber needle was used to withdraw blood and flush the port with heparinized saline. Dermabond was then placed as a dressing. The patient tolerated the procedure well and was taken to the recovery room in stable condition.     ____________________________ Algernon Huxley, MD jsd:DT D: 08/05/2014 10:52:55 ET T: 08/05/2014 12:19:26 ET JOB#: [id number]  cc: Algernon Huxley, MD, <Dictator> Algernon Huxley MD ELECTRONICALLY SIGNED 08/23/2014 11:41

## 2015-01-10 NOTE — Consult Note (Signed)
Reason for Visit: This 59 year old Male patient presents to the clinic for initial evaluation of  head and neck cancer .   Referred by Dr Ma Hillock.  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 He presented to his PMD in November 2015 was found to have a 4.1 x 3.4 cm right tonsillar mass and needlebiopsy confirmed invasive poorly differentiated squamous cell carcinoma. He was seen by medical oncology. PET CT scan demonstrated large right tonsillar mass and hyperbolic right level II and 3 lymph nodes consistent with metastatic disease. No other metastatic disease was seen.patient underwent induction chemotherapy consisting of 3 cyclesofchemotherapy.patient has significant past medical history significant for atrial fibrillation, hypertension +PPD.he was treated with cis-platinum Taxotere and 5-FU and repeat CT scan showed near complete responseof the tonsillar mass and marked improvement in the right cervical adenopathy.he is seen today for consideration of combined modality treatment with curative intent. He has had taste alteration. No dysphagia or head and neck pain at this time.  Past Hx:    hyperlipidemia:    hypertension:    tonsilar cancer:    Atrial Fibrillation:    postitive PPD:    right hand surdery:   Past, Family and Social History:  Past Medical History positive   Cardiovascular atrial fibrillation; hyperlipidemia; hypertension   Past Surgical History right hand surgery   Past Medical History Comments positive PPD   Family History  positive   Family History Comments family history of hypertension cardiac vascular heart disease CVA   Social History positive   Social History Comments 20 pack year smoking history quit 20 years ago social EtOH use history   Additional Past Medical and Surgical History accompanied by his wife today   Allergies:   No Known Allergies:   Home Meds:  Home Medications: Medication Instructions Status  Ensure Clear * 200 milliliter(s) orally 2 times a day (with meals) for a week  Active  nystatin 100,000 units/mL oral suspension 5 milliliter(s) orally every 6 hours Active  acetaminophen 325 mg oral tablet 2 tab(s) orally every 4 hours, As needed, mild pain (1-3/10) or temp. greater than 100.4 Active  Compazine tablet 10 mg 1 tab(s) orally every 6 hours x 30 days as needed for nausea Active  simvastatin 40 mg oral tablet 1 tab(s) orally once a day (at bedtime) Active  lidocaine topical 2% topical gel with applicator Apply topically to affected area once Active  lidocaine and Nystatin oral mouthwash 5 milliliter(s)  every 4 hours swish and swallow Active  NexIUM 40 mg oral delayed release capsule 1 cap(s) orally once a day Active  spironolactone 25 mg oral tablet 1 tab(s) orally once a day Active  atenolol 100 mg oral tablet 1 tab(s) orally once a day Active  Diovan 40 mg oral tablet 1 tab(s) orally once a day Active   Review of Systems:  General negative   Performance Status (ECOG) 0   Skin negative   Breast negative   Ophthalmologic negative   ENMT see HPI   Respiratory and Thorax negative   Cardiovascular negative  Gastrointestinal negative   Genitourinary negative   Musculoskeletal negative   Neurological negative   Psychiatric negative   Hematology/Lymphatics negative   Endocrine negative   Allergic/Immunologic negative   Review of Systems   denies any weight loss, fatigue, weakness, fever, chills or night sweats. Patient denies any loss of vision,  blurred vision. Patient denies any ringing  of the ears or hearing loss. No irregular heartbeat. Patient denies heart murmur or history of fainting. Patient denies any chest pain or pain radiating to her upper extremities. Patient denies any shortness of breath, difficulty breathing at night, cough or hemoptysis. Patient denies any swelling in the lower legs. Patient denies any nausea vomiting, vomiting of blood, or coffee ground material in the vomitus. Patient denies any stomach pain. Patient states has had normal bowel movements no significant constipation or diarrhea. Patient denies any dysuria, hematuria or significant nocturia. Patient denies any problems walking, swelling in the joints or loss of balance. Patient denies any skin changes, loss of hair or loss of weight. Patient denies any excessive worrying or anxiety or significant depression. Patient denies any problems with insomnia. Patient denies excessive thirst, polyuria, polydipsia. Patient denies any swollen glands, patient denies easy bruising or easy bleeding. Patient denies any recent infections, allergies or URI. Patient "s visual fields have not changed significantly in recent time.   Nursing Notes:  Nursing Vital Signs and Chemo Nursing Nursing Notes: *CC Vital Signs Flowsheet:   08-Feb-16 10:21  Temp Temperature 97.2  Pulse Pulse 82  Respirations Respirations 18  SBP SBP 135  Pain Scale (0-10)  0  Current Weight (kg) (kg) 109.6  Height (cm) centimeters 177.8  BSA (m2) 2.2   Physical Exam:  General/Skin/HEENT:  General normal   Skin normal   Eyes normal   Additional PE well-developed slightly obese male in NAD. He has a slight skin rash secondary to chemotherapy. Patient is edentulous wears upper and lower dentures. No oral mucosal lesions are identified. Right tonsillar region appears within normal limits. Indirect mirror examination shows upper airway clear vallecula and base of tongue within normal limits. There is  still some persistent small adenopathy in the right mid cervical chain. Left neck is clear supraclavicular fossa is negative. Lungs are clear to A&P cardiac examination shows an irregular irregular heartbeat.   Breasts/Resp/CV/GI/GU:  Respiratory and Thorax normal   Cardiovascular normal   Gastrointestinal normal   Genitourinary normal   MS/Neuro/Psych/Lymph:  Musculoskeletal normal   Neurological normal   Lymphatics normal   Other Results:  Radiology Results: CT:    06-Nov-15 10:22, CT Neck With Contrast  CT Neck With Contrast   REASON FOR EXAM:    Mass on R Side of Neck  COMMENTS:       PROCEDURE: KCT - KCT NECK WITH CONTRAST  - Jul 17 2014 10:22AM     CLINICAL DATA:  Right neck swelling    EXAM:  CT NECK WITH CONTRAST    TECHNIQUE:  Multidetector CT imaging of the neck wasperformed using the  standard protocol following the bolus administration of intravenous  contrast.  CONTRAST:  75 mL Isovue 370 IV    COMPARISON:  None.    FINDINGS:  Large mass in the right tonsil measuring 3.4 x 2.7 x 4.1 cm. This  shows homogeneous solid enhancement. This is compatible with  neoplasm. Malignant adenopathy in the right neck. Right level 2  lymph node measures 16 x 21 mm. Adjacent right level 2 node measures  18 x  13 mm. Posterior lymph nodes have low-density necrotic center  compatible with metastatic disease. Multiple posterior nodes are  present measuring 9 mm, 14 mm, and 8 mm in diameter.    No adenopathy in the left neck  Tongue is normal.    Mild asymmetry of the larynx with enlargement of the ventricle on  the right.This could be due to paresis of the right vocal cord. No  mass lesion. Direct visualization recommended.    Parotid and submandibular glands are normal. Thyroid normal. No  acute bony change. Lung apices clear.     IMPRESSION:  Large right tonsillar mass compatible with carcinoma with spread to  multiple right-sided lymph nodes.    Asymmetry  of the larynx. Recommend direct visualization. Possible  right cord paresis.  Electronically Signed    By: Franchot Gallo M.D.    On: 07/17/2014 12:01         Verified By: Truett Perna, M.D.,    11-Jan-16 13:25, CT Neck With Contrast  CT Neck With Contrast   REASON FOR EXAM:    Restaging Head and Neck CA on chemo  COMMENTS:       PROCEDURE: CT  - CT NECK WITH CONTRAST  - Sep 21 2014  1:25PM     CLINICAL DATA:  Tonsillar cancer, assess for response to treatment.    EXAM:  CT NECK WITH CONTRAST    TECHNIQUE:  Multidetector CT imaging of the neck was performed using the  standard protocol following the bolus administration of intravenous  contrast.  CONTRAST:  Omnipaque 350, 75 mL.    COMPARISON:  07/17/2014.    FINDINGS:  Pharynx and larynx: Near complete response to treatment of the  primary lesion in the RIGHT pharynx. No measurable tumor at this  level. Vocal cords now appear normal.    Salivary glands: Unremarkable.    Thyroid: Normal.    Lymph nodes: Significant regression of adenopathy. Representative  level III node image 63 measures 7 x 10 mm as compared to 14 x 7 mm  previously. Similar improvement in RIGHT level II nodes.    Vascular: Negative    Limited intracranial: Negative    Mastoids and visualized paranasal sinuses: Negative.    Skeleton: Negative.  Moderate spondylosis C5-C6    Upper chest: Negative. Granulomatous calcifications in the  mediastinum. Intravascular Port-A-Cath.     IMPRESSION:  Near-complete response to treatment of the RIGHT tonsillar mass.  Significant improvement regional lymphadenopathy. No new lesions are  seen.      Electronically Signed    By: Rolla Flatten M.D.    On: 09/21/2014 14:58         Verified By: Staci Righter, M.D.,  Nuclear Med:    [id number] 14:49, PET/CT Scan Head/Neck CA Initial Staging  PET/CT Scan Head/Neck CA Initial Staging   REASON FOR EXAM:    Staging Large R Tonsillary Squamous Cell CA  with R   Neck Lymphadenopathy  COMMENTS:       PROCEDURE: PET - PET/CT INIT STAG HEAD/NECK CA  - Jul 28 2014  2:49PM     CLINICAL DATA:  Initial treatment strategy for carcinoma of the  right tonsil.    EXAM:  NUCLEAR MEDICINE PET SKULL BASE TO THIGH    TECHNIQUE:  12.1 mCi F-18 FDG was injected intravenously. Full-ring PET imaging  was performed from the skull base to thigh after the radiotracer. CT  data was obtained and used for attenuation correction and anatomic  localization.  FASTING BLOOD GLUCOSE:  Value: 89 mg/dl    COMPARISON:  CT neck 07/17/2014.    FINDINGS:  NECK    The large right tonsil mass exhibits intense FDG uptake within SUV  max equal to 22. Increased FDG uptake is noted within the left  tonsillar region with an SUV max equal to 7.7.    There are multiple hypermetabolic right cervical lymph nodes. Index  right level 2 lymph node measures 1.9 by 1.3 cm and has an SUV max  equal to 10.8. Hypermetabolic right level 3 lymph node measures 9 mm  and has an SUV max equal to 4.    CHEST    No hypermetabolic mediastinal or hilar nodes. No suspicious  pulmonary nodules on the CT scan.    ABDOMEN/PELVIS    No abnormal hypermetabolic activity within the liver, pancreas,  adrenal glands, or spleen. No hypermetabolic lymph nodes in the  abdomen or pelvis.  SKELETON    No focal hypermetabolic activity to suggest skeletal metastasis.     IMPRESSION:  1. Large right tonsillar mass exhibits intense FDG uptake compatible  with primary tonsillar carcinoma.  2. Hypermetabolic right level 2 and level 3 lymph nodes consistent  with metastatic adenopathy.  3. No evidence for metastatic disease to the chest, abdomen or  pelvis.      Electronically Signed    By: Kerby Moors M.D.    On: 07/28/2014 17:32         Verified By: Angelita Ingles, M.D.,   Relevent Results:   Relevant Scans and Labs erial CT scans and PET CT scan reviewed   Assessment and  Plan: Impression:   at least stage IV a poorly differential is Bryan Larson carcinoma right tonsil with bulky right cervical adenopathy status post induction chemotherapy 3 with excellent response in 59 year old male Plan:   the stomach to go ahead with IM RT radiation therapy to his head and neck region. Would plan on delivering 7000 cGy to areas of original tumor involvement both in the tonsillar region as well as his cervical adenopathy. We'll treat the rest of the neck up to 5400 cGy using IM RT dose painting technique. Risks and benefits of treatment including dysphagia, sore throat, xerostomia, alteration of taste, skin reaction, altered ration of blood counts, all were discussed in detail with the patient and his wife. Both seem to comprehend my treatment plan well. I have set him up for CT simulation later this week. We'll also coordinate concurrent chemotherapy with his radiation with medical oncology.  I would like to take this opportunity for allowing me to participate in the care of your patient..  Fax to Physician:  Physicians To Recieve Fax: Riley Nearing - [phone].  Electronic Signatures: Armstead Peaks (MD)  (Signed 08-Feb-16 12:36)  Authored: HPI, Diagnosis, Past Hx, PFSH, Allergies, Home Meds, ROS, Nursing Notes, Physical Exam, Other Results, Relevent Results, Encounter Assessment and Plan, Fax to Physician   Last Updated: 08-Feb-16 12:36 by Armstead Peaks (MD)

## 2015-02-04 ENCOUNTER — Other Ambulatory Visit: Payer: Self-pay | Admitting: *Deleted

## 2015-02-04 DIAGNOSIS — Z95828 Presence of other vascular implants and grafts: Secondary | ICD-10-CM

## 2015-02-04 MED ORDER — SODIUM CHLORIDE 0.9 % IJ SOLN
10.0000 mL | INTRAMUSCULAR | Status: AC | PRN
  Filled 2015-02-04: qty 10

## 2015-02-04 MED ORDER — HEPARIN SOD (PORK) LOCK FLUSH 100 UNIT/ML IV SOLN: 500.0000 [IU] | Freq: Once | INTRAVENOUS | Status: AC

## 2015-02-05 ENCOUNTER — Inpatient Hospital Stay: Payer: Federal, State, Local not specified - PPO | Attending: Internal Medicine

## 2015-02-05 DIAGNOSIS — Z452 Encounter for adjustment and management of vascular access device: Secondary | ICD-10-CM | POA: Insufficient documentation

## 2015-02-05 DIAGNOSIS — C801 Malignant (primary) neoplasm, unspecified: Secondary | ICD-10-CM

## 2015-02-05 DIAGNOSIS — C099 Malignant neoplasm of tonsil, unspecified: Secondary | ICD-10-CM | POA: Diagnosis present

## 2015-02-05 MED ORDER — SODIUM CHLORIDE 0.9 % IJ SOLN
10.0000 mL | INTRAMUSCULAR | Status: DC | PRN
  Administered 2015-02-05: 10 mL via INTRAVENOUS
  Filled 2015-02-05: qty 10

## 2015-02-05 MED ORDER — HEPARIN SOD (PORK) LOCK FLUSH 100 UNIT/ML IV SOLN
500.0000 [IU] | Freq: Once | INTRAVENOUS | Status: AC
  Administered 2015-02-05: 500 [IU] via INTRAVENOUS

## 2015-02-05 MED ORDER — HEPARIN SOD (PORK) LOCK FLUSH 100 UNIT/ML IV SOLN
INTRAVENOUS | Status: AC
  Filled 2015-02-05: qty 5

## 2015-02-19 ENCOUNTER — Encounter: Payer: Self-pay | Admitting: Radiation Oncology

## 2015-02-19 ENCOUNTER — Ambulatory Visit
Admission: RE | Admit: 2015-02-19 | Discharge: 2015-02-19 | Disposition: A | Discharge status: Home / Self Care | Payer: Federal, State, Local not specified - PPO | Source: Ambulatory Visit | Attending: Radiation Oncology | Admitting: Radiation Oncology

## 2015-02-19 VITALS — BP 138/95 | HR 78 | Temp 96.6°F | Ht 70.0 in | Wt 188.3 lb

## 2015-02-19 DIAGNOSIS — C099 Malignant neoplasm of tonsil, unspecified: Secondary | ICD-10-CM

## 2015-02-19 HISTORY — DX: Essential (primary) hypertension: I10

## 2015-02-19 HISTORY — DX: Personal history of other endocrine, nutritional and metabolic disease: Z86.39

## 2015-02-19 HISTORY — DX: Malignant (primary) neoplasm, unspecified: C80.1

## 2015-02-19 NOTE — Progress Notes (Signed)
Radiation Oncology Follow up Note  Name: Bryan Larson   Date:   02/19/2015 MRN:  [id number] DOB: 1956/02/19    This 59 y.o. male presents to the clinic today for follow-up of stage for a squamous cell carcinoma the right tonsil with bulky adenopathy status post induction chemotherapy followed by concurrent chemoradiation.  REFERRING PROVIDER: Marinda Elk, MD  HPI: Patient is a 59 year old male now one month out having completed combined modality treatment for stage for a (T3 N2 B N0) squamous cell carcinoma of the right tonsil with bulky adenopathy in the right neck. Patient underwent induction chemotherapy followed by 7000 cGy to his head and neck with concurrent chemotherapy. He is seen today in routine follow-up and is doing well. He specifically denies head and neck pain or dysphagia. He has not yet had any follow-up imaging performed..  COMPLICATIONS OF TREATMENT: none  FOLLOW UP COMPLIANCE: keeps appointments   PHYSICAL EXAM:  BP 138/95 mmHg  Pulse 78  Temp(Src) 96.6 F (35.9 C)  Ht 5\' 10"  (1.778 m)  Wt 188 lb 4.4 oz (85.4 kg)  BMI 27.01 kg/m2 Well-developed male in NAD. Oral cavity is clear tonsil region within normal limits. Indirect mirror examination shows upper airway clear vallecula and base of tongue within normal limits. Neck is clear without evidence of subject gastric cervical or supraclavicular adenopathy. Well-developed well-nourished patient in NAD. HEENT reveals PERLA, EOMI, discs not visualized.  Oral cavity is clear. No oral mucosal lesions are identified. Neck is clear without evidence of cervical or supraclavicular adenopathy. Lungs are clear to A&P. Cardiac examination is essentially unremarkable with regular rate and rhythm without murmur rub or thrill. Abdomen is benign with no organomegaly or masses noted. Motor sensory and DTR levels are equal and symmetric in the upper and lower extremities. Cranial nerves II through XII are grossly intact.  Proprioception is intact. No peripheral adenopathy or edema is identified. No motor or sensory levels are noted. Crude visual fields are within normal range.   RADIOLOGY RESULTS: PET CT scan to be ordered in about 3 months  PLAN: At this time patient is doing extremely well status post induction chemotherapy and combined modality treatment for stage IV a tonsillar squamous cell carcinoma. I am please was overall progress. I have set up a follow-up appointment with Dr. Loni Muse in about 3 months and prior to that he will have a PET CT scan for evaluation. I will see him back in about 5 months for follow-up. Patient continues to do extremely well with very little side effects. I am please was overall progress.  I would like to take this opportunity for allowing me to participate in the care of your patient.Armstead Peaks., MD

## 2015-04-01 ENCOUNTER — Other Ambulatory Visit: Payer: Self-pay | Admitting: *Deleted

## 2015-04-01 DIAGNOSIS — C099 Malignant neoplasm of tonsil, unspecified: Secondary | ICD-10-CM

## 2015-04-09 ENCOUNTER — Other Ambulatory Visit: Payer: Self-pay | Admitting: *Deleted

## 2015-04-09 ENCOUNTER — Inpatient Hospital Stay: Payer: Federal, State, Local not specified - PPO | Attending: Internal Medicine

## 2015-04-09 DIAGNOSIS — Z452 Encounter for adjustment and management of vascular access device: Secondary | ICD-10-CM | POA: Insufficient documentation

## 2015-04-09 DIAGNOSIS — C099 Malignant neoplasm of tonsil, unspecified: Secondary | ICD-10-CM | POA: Diagnosis not present

## 2015-04-09 DIAGNOSIS — Z95828 Presence of other vascular implants and grafts: Secondary | ICD-10-CM

## 2015-04-09 DIAGNOSIS — C801 Malignant (primary) neoplasm, unspecified: Secondary | ICD-10-CM

## 2015-04-09 MED ORDER — HEPARIN SOD (PORK) LOCK FLUSH 100 UNIT/ML IV SOLN: 500.0000 [IU] | Freq: Once | INTRAVENOUS | Status: AC

## 2015-04-09 MED ORDER — SODIUM CHLORIDE 0.9 % IJ SOLN
10.0000 mL | INTRAMUSCULAR | Status: AC | PRN
  Filled 2015-04-09: qty 10

## 2015-04-09 MED ORDER — HEPARIN SOD (PORK) LOCK FLUSH 100 UNIT/ML IV SOLN
500.0000 [IU] | Freq: Once | INTRAVENOUS | Status: AC
  Administered 2015-04-09: 500 [IU]
  Filled 2015-04-09: qty 5

## 2015-04-09 MED ORDER — SODIUM CHLORIDE 0.9 % IJ SOLN
10.0000 mL | INTRAMUSCULAR | Status: AC | PRN
  Administered 2015-04-09: 10 mL via INTRAVENOUS
  Filled 2015-04-09: qty 10

## 2015-05-31 ENCOUNTER — Ambulatory Visit
Admission: RE | Admit: 2015-05-31 | Discharge: 2015-05-31 | Disposition: A | Discharge status: Home / Self Care | Payer: Federal, State, Local not specified - PPO | Source: Ambulatory Visit | Attending: Radiation Oncology | Admitting: Radiation Oncology

## 2015-05-31 DIAGNOSIS — C099 Malignant neoplasm of tonsil, unspecified: Secondary | ICD-10-CM | POA: Insufficient documentation

## 2015-05-31 LAB — GLUCOSE, CAPILLARY: Glucose-Capillary: 99 mg/dL (ref 65–99)

## 2015-05-31 MED ORDER — FLUDEOXYGLUCOSE F - 18 (FDG) INJECTION
12.9100 | Freq: Once | INTRAVENOUS | Status: DC | PRN
  Administered 2015-05-31: 12.91 via INTRAVENOUS
  Filled 2015-05-31: qty 12.91

## 2015-06-04 ENCOUNTER — Inpatient Hospital Stay: Payer: Federal, State, Local not specified - PPO

## 2015-06-04 ENCOUNTER — Inpatient Hospital Stay: Payer: Federal, State, Local not specified - PPO | Attending: Internal Medicine | Admitting: Internal Medicine

## 2015-06-04 VITALS — BP 142/92 | HR 93 | Temp 96.8°F | Resp 18 | Ht 70.0 in | Wt 176.1 lb

## 2015-06-04 DIAGNOSIS — H905 Unspecified sensorineural hearing loss: Secondary | ICD-10-CM | POA: Insufficient documentation

## 2015-06-04 DIAGNOSIS — Z923 Personal history of irradiation: Secondary | ICD-10-CM | POA: Diagnosis not present

## 2015-06-04 DIAGNOSIS — Z85818 Personal history of malignant neoplasm of other sites of lip, oral cavity, and pharynx: Secondary | ICD-10-CM

## 2015-06-04 DIAGNOSIS — R21 Rash and other nonspecific skin eruption: Secondary | ICD-10-CM | POA: Diagnosis not present

## 2015-06-04 DIAGNOSIS — I4891 Unspecified atrial fibrillation: Secondary | ICD-10-CM | POA: Diagnosis not present

## 2015-06-04 DIAGNOSIS — Z7982 Long term (current) use of aspirin: Secondary | ICD-10-CM | POA: Diagnosis not present

## 2015-06-04 DIAGNOSIS — I10 Essential (primary) hypertension: Secondary | ICD-10-CM | POA: Diagnosis not present

## 2015-06-04 DIAGNOSIS — I510 Cardiac septal defect, acquired: Secondary | ICD-10-CM

## 2015-06-04 DIAGNOSIS — Z87891 Personal history of nicotine dependence: Secondary | ICD-10-CM | POA: Diagnosis not present

## 2015-06-04 DIAGNOSIS — C099 Malignant neoplasm of tonsil, unspecified: Secondary | ICD-10-CM

## 2015-06-04 DIAGNOSIS — R7611 Nonspecific reaction to tuberculin skin test without active tuberculosis: Secondary | ICD-10-CM | POA: Insufficient documentation

## 2015-06-04 DIAGNOSIS — E785 Hyperlipidemia, unspecified: Secondary | ICD-10-CM | POA: Diagnosis not present

## 2015-06-04 DIAGNOSIS — Z809 Family history of malignant neoplasm, unspecified: Secondary | ICD-10-CM | POA: Insufficient documentation

## 2015-06-04 DIAGNOSIS — I2510 Atherosclerotic heart disease of native coronary artery without angina pectoris: Secondary | ICD-10-CM | POA: Diagnosis not present

## 2015-06-04 DIAGNOSIS — Z9221 Personal history of antineoplastic chemotherapy: Secondary | ICD-10-CM | POA: Insufficient documentation

## 2015-06-04 DIAGNOSIS — K5730 Diverticulosis of large intestine without perforation or abscess without bleeding: Secondary | ICD-10-CM | POA: Insufficient documentation

## 2015-06-04 LAB — CBC WITH DIFFERENTIAL/PLATELET
BASOS ABS: 0.1 10*3/uL (ref 0–0.1)
Basophils Relative: 2 %
EOS PCT: 3 %
Eosinophils Absolute: 0.1 10*3/uL (ref 0–0.7)
HCT: 47.6 % (ref 40.0–52.0)
HEMOGLOBIN: 16.1 g/dL (ref 13.0–18.0)
LYMPHS PCT: 19 %
Lymphs Abs: 0.6 10*3/uL — ABNORMAL LOW (ref 1.0–3.6)
MCH: 31.1 pg (ref 26.0–34.0)
MCHC: 33.8 g/dL (ref 32.0–36.0)
MCV: 92.2 fL (ref 80.0–100.0)
Monocytes Absolute: 0.3 10*3/uL (ref 0.2–1.0)
Monocytes Relative: 10 %
NEUTROS ABS: 2.1 10*3/uL (ref 1.4–6.5)
NEUTROS PCT: 66 %
Platelets: 149 10*3/uL — ABNORMAL LOW (ref 150–440)
RBC: 5.16 MIL/uL (ref 4.40–5.90)
RDW: 13.7 % (ref 11.5–14.5)
WBC: 3.2 10*3/uL — AB (ref 3.8–10.6)

## 2015-06-04 LAB — CREATININE, SERUM
Creatinine, Ser: 0.94 mg/dL (ref 0.61–1.24)
GFR calc non Af Amer: >60 mL/min (ref >60)

## 2015-06-04 LAB — HEPATIC FUNCTION PANEL
ALT: 15 U/L — ABNORMAL LOW (ref 17–63)
AST: 18 U/L (ref 15–41)
Albumin: 4.8 g/dL (ref 3.5–5.0)
Alkaline Phosphatase: 66 U/L (ref 38–126)
BILIRUBIN DIRECT: 0.2 mg/dL (ref 0.1–0.5)
Indirect Bilirubin: 0.8 mg/dL (ref 0.3–0.9)
TOTAL PROTEIN: 7.5 g/dL (ref 6.5–8.1)
Total Bilirubin: 1 mg/dL (ref 0.3–1.2)

## 2015-06-04 LAB — CALCIUM: CALCIUM: 9.2 mg/dL (ref 8.9–10.3)

## 2015-06-04 NOTE — Progress Notes (Signed)
Pt here for f/u of head and neck cancer and some dryness of the mouth and has water at bedside at night and uses biotene for dryness at times

## 2015-06-19 NOTE — Progress Notes (Addendum)
[facility]  Telephone:([phone]) [phone] [phone]     ID: Denyse Dago OB: [birth year]  MR#: [id number]  WPY#:[id number]  Patient Care Team: Marinda Elk, MD as PCP - General (Physician Assistant)  CHIEF COMPLAINT/DIAGNOSIS:  Right tonsillar cancer diagnosed on CT scan of the neck on 07/17/14 showing right tonsillar mass 3.4 x 2.7 x 4.1 cm with malignant adenopathy in the right neck. Status post ENT evaluation and needle biopsy of right tonsil on 07/20/14, pathology report confirming Invasive Poorly Differentiated Squamous Cell Carcinoma. Positive High Risk HPV 16/18  (negative for HPV 6/11 and 31/33). Clinical stage cT3 cN2b cMx ( at least stage IV A).  -  Got 3 cycles of induction chemo with cisplatin/taxotere/5-FU (08/10/14 - 09/28/14). Then completed radiation (11/03/14 - 01/01/15). Decided not to take concurrent Erbitux with radiation.   HPI:   Patient returns for oncology followup, he was seen a few months ago. Overall states that he is doing fairly steady and denies any active complaints. Eating better, states that soreness in the throat has resolved. No dysphagia, neck pain or hoarseness of voice. No fevers. No nausea, vomiting, or diarrhea. No new paresthesias in extremities. Denies any recurrent masses in the neck. No new bone pains. Remains physically active.    Review of Systems   As in HPI above. In addition, no new headaches or epistaxis. Has chronic hearing changes. No new cough, shortness of breath or hemoptysis. No retrosternal chest pain, orthopnea, PND. No abdominal pain. No vomiting or diarrhea. No dysuria or hematuria. No rashes or bruising. No numbness or tingling of extremities. PS ECOG 1.    Past Medical History/past Surgical History -   Atrial fibrillation  Hypertension  Hyperlipidemia  History of positive PPD  Left-sided sensorineural hearing loss  Right hand surgery  Right tonsillar cancer diagnosed on CT scan of the neck on  07/17/14 showing right tonsillar mass 3.4 x 2.7 x 4.1 cm with malignant adenopathy in the right neck.    Family History - remarkable for hypertension, heart disease, stroke. Maternal grandfather had cancer, type unknown to patient.    Social History - exposure, quit 19 years ago. Occasional alcohol intake. Denies recreational drug usage. Physically active and ambulatory.   ROS SOCIAL HISTORY: Reviewed. Social History  Substance Use Topics  . Smoking status: Former Smoker    Quit date: 09/20/2005  . Smokeless tobacco: Never Used  . Alcohol Use: Not on file    No Known Allergies  Current Outpatient Prescriptions  Medication Sig Dispense Refill  . antiseptic oral rinse (BIOTENE) LIQD 15 mLs by Mouth Rinse route as needed for dry mouth.    Marland Kitchen aspirin 81 MG tablet Take 81 mg by mouth daily.     No current facility-administered medications for this visit.   Facility-Administered Medications Ordered in Other Visits  Medication Dose Route Frequency Provider Last Rate Last Dose  . heparin lock flush 100 unit/mL  500 Units Intravenous Once Leia Alf, MD      . heparin lock flush 100 unit/mL  500 Units Intravenous Once Leia Alf, MD      . sodium chloride 0.9 % injection 10 mL  10 mL Intravenous PRN Leia Alf, MD      . sodium chloride 0.9 % injection 10 mL  10 mL Intravenous PRN Leia Alf, MD      . sodium chloride 0.9 % injection 10 mL  10 mL Intravenous PRN Leia Alf, MD   10 mL at 04/09/15 [phone]  PHYSICAL EXAM: Filed Vitals:   06/04/15 1154  BP: 142/92  Pulse: 93  Temp: 96.8 F (36 C)  Resp: 18     Body mass index is 25.27 kg/(m^2).    ECOG FS:1 - Symptomatic but completely ambulatory  GENERAL: Patient is alert and oriented and in no acute distress. There is no icterus. HEENT: EOMs intact. Oral exam negative for thrush or lesions. No cervical lymphadenopathy. CVS: S1S2, regular LUNGS: Bilaterally clear to auscultation, no rhonchi. ABDOMEN: Soft,  nontender. No hepatomegaly clinically.  NEURO: grossly nonfocal, cranial nerves are intact. Gait unremarkable. EXTREMITIES: No pedal edema.   LAB RESULTS:    Component Value Date/Time   NA 139 10/12/2014 1017   K 4.4 10/12/2014 1017   CL 102 10/12/2014 1017   CO2 28 10/12/2014 1017   GLUCOSE 115* 10/12/2014 1017   BUN 11 10/12/2014 1017   CREATININE 0.94 06/04/2015 1227   CREATININE 1.13 10/12/2014 1017   CALCIUM 9.2 06/04/2015 1227   CALCIUM 8.4* 10/12/2014 1017   PROT 7.5 06/04/2015 1227   PROT 5.9* 10/12/2014 1017   ALBUMIN 4.8 06/04/2015 1227   ALBUMIN 3.5 10/12/2014 1017   AST 18 06/04/2015 1227   AST 15 10/12/2014 1017   ALT 15* 06/04/2015 1227   ALT 23 10/12/2014 1017   ALKPHOS 66 06/04/2015 1227   ALKPHOS 86 10/12/2014 1017   BILITOT 1.0 06/04/2015 1227   BILITOT 0.3 10/12/2014 1017   GFRNONAA >60 06/04/2015 1227   GFRNONAA >60 10/12/2014 1017   GFRAA >60 06/04/2015 1227   GFRAA >60 10/12/2014 1017    Lab Results  Component Value Date   WBC 3.2* 06/04/2015   NEUTROABS 2.1 06/04/2015   HGB 16.1 06/04/2015   HCT 47.6 06/04/2015   MCV 92.2 06/04/2015   PLT 149* 06/04/2015    STUDIES: 07/28/14 - PET scan. IMPRESSION:  1. Large right tonsillar mass exhibits intense FDG uptake compatible with primary tonsillar carcinoma.  2. Hypermetabolic right level 2 and level 3 lymph nodes consistent with metastatic adenopathy.  3. No evidence for metastatic disease to the chest, abdomen or pelvis.  09/21/14 - CT neck. IMPRESSION: Near-complete response to treatment of the RIGHT tonsillar mass. Significant improvement regional lymphadenopathy. No new lesions are seen.   Nm Pet Image Initial (pi) Whole Body  05/31/2015   CLINICAL DATA:  Subsequent treatment strategy for squamous cell tonsillar cancer  EXAM: NUCLEAR MEDICINE PET SKULL BASE TO THIGH  TECHNIQUE: 12.91 mCi F-18 FDG was injected intravenously. Full-ring PET imaging was performed from the vertex to thigh after the  radiotracer. CT data was obtained and used for attenuation correction and anatomic localization.  FASTING BLOOD GLUCOSE:  Value:  99 mg/dl  COMPARISON:  PET-CT dated 07/28/2014  FINDINGS: NECK  No hypermetabolic lymph nodes in the neck.  Prior right tonsillar mass and hypermetabolic right cervical lymph nodes are no longer visualized.  CHEST  No hypermetabolic mediastinal or hilar nodes. Calcified prevascular/left suprahilar lymph nodes.  The heart is top-normal in size. Coronary atherosclerosis. Right chest port terminating at the cavoatrial junction.  No suspicious pulmonary nodules on the CT scan. No pleural effusion or pneumothorax.  ABDOMEN/PELVIS  No abnormal hypermetabolic activity within the liver, pancreas, adrenal glands, or spleen.  Mild atherosclerotic calcifications of the aortic arch and branch vessels. Colonic diverticulosis. Bladder is mildly thick-walled although underdistended. Dystrophic calcifications of the prostate.  No hypermetabolic abdominopelvic lymphadenopathy.  SKELETON  No focal hypermetabolic activity to suggest skeletal metastasis.  IMPRESSION: No evidence of recurrent or  metastatic disease.   Electronically Signed   By: Julian Hy M.D.   On: 05/31/2015 11:48     ASSESSMENT / PLAN:   1. Right tonsillar cancer diagnosed on CT scan of the neck on 07/17/14 showing right tonsillar mass 3.4 x 2.7 x 4.1 cm with malignant adenopathy in the right neck. Status post ENT evaluation and needle biopsy of right tonsil on 07/20/14, pathology reported Invasive Poorly Differentiated Squamous Cell Carcinoma. Positive High Risk HPV 16/18. Clinical stage cT3 cN2b cMx (at least stage IV A). Patient starting induction chemo with cisplatin/taxotere/5-FU today (08/10/14)  -  have reviewed PET scan from 05/31/2015 and discussed with patient and family pleasant, have explained that there is no recurrent or metastatic disease on CT scan, and he has had excellent response to treatment so far. Plan is to  continue surveillance, he was advised to follow up with ENT also in the near future. Patient states that he visits with Dr.Chrystal in 2-3 months, and will therefore see him back at 24 weeks with repeat labs.. 2. Skin rash - improved.  3. Pain is under control at this time. 4. Refused flu shot today. 5. In between visits, patient advised to call or come to ER in case of any new symptoms or acute sickness. He is agreeable to this plan.    Leia Alf, MD   06/19/2015 12:25 AM

## 2015-06-21 ENCOUNTER — Ambulatory Visit
Admission: RE | Admit: 2015-06-21 | Discharge: 2015-06-21 | Disposition: A | Discharge status: Home / Self Care | Payer: Federal, State, Local not specified - PPO | Source: Ambulatory Visit | Attending: Vascular Surgery | Admitting: Vascular Surgery

## 2015-06-21 ENCOUNTER — Encounter
Admission: RE | Disposition: A | Discharge status: Home / Self Care | Payer: Self-pay | Source: Ambulatory Visit | Attending: Vascular Surgery

## 2015-06-21 DIAGNOSIS — Z7982 Long term (current) use of aspirin: Secondary | ICD-10-CM | POA: Diagnosis not present

## 2015-06-21 DIAGNOSIS — I499 Cardiac arrhythmia, unspecified: Secondary | ICD-10-CM | POA: Insufficient documentation

## 2015-06-21 DIAGNOSIS — Z79899 Other long term (current) drug therapy: Secondary | ICD-10-CM | POA: Insufficient documentation

## 2015-06-21 DIAGNOSIS — Z452 Encounter for adjustment and management of vascular access device: Secondary | ICD-10-CM | POA: Diagnosis not present

## 2015-06-21 DIAGNOSIS — C760 Malignant neoplasm of head, face and neck: Secondary | ICD-10-CM | POA: Insufficient documentation

## 2015-06-21 HISTORY — PX: PERIPHERAL VASCULAR CATHETERIZATION: SHX172C

## 2015-06-21 SURGERY — PORTA CATH REMOVAL
Anesthesia: Moderate Sedation

## 2015-06-21 MED ORDER — FENTANYL CITRATE (PF) 100 MCG/2ML IJ SOLN
INTRAMUSCULAR | Status: DC | PRN
  Administered 2015-06-21: 100 ug via INTRAVENOUS

## 2015-06-21 MED ORDER — CEFAZOLIN SODIUM 1-5 GM-% IV SOLN
INTRAVENOUS | Status: AC
  Filled 2015-06-21: qty 50

## 2015-06-21 MED ORDER — FENTANYL CITRATE (PF) 100 MCG/2ML IJ SOLN
INTRAMUSCULAR | Status: AC
  Filled 2015-06-21: qty 2

## 2015-06-21 MED ORDER — MIDAZOLAM HCL 5 MG/5ML IJ SOLN
INTRAMUSCULAR | Status: AC
  Filled 2015-06-21: qty 5

## 2015-06-21 MED ORDER — LIDOCAINE-EPINEPHRINE (PF) 1 %-1:200000 IJ SOLN
INTRAMUSCULAR | Status: AC
  Filled 2015-06-21: qty 30

## 2015-06-21 MED ORDER — SODIUM CHLORIDE 0.9 % IV SOLN
INTRAVENOUS | Status: DC
Start: 2015-06-21 — End: 2015-06-21
  Administered 2015-06-21: 11:00:00 via INTRAVENOUS

## 2015-06-21 MED ORDER — LIDOCAINE-EPINEPHRINE (PF) 1 %-1:200000 IJ SOLN
INTRAMUSCULAR | Status: DC | PRN
  Administered 2015-06-21: 10 mL via INTRADERMAL

## 2015-06-21 MED ORDER — MIDAZOLAM HCL 2 MG/2ML IJ SOLN
INTRAMUSCULAR | Status: DC | PRN
  Administered 2015-06-21: 2 mg via INTRAVENOUS

## 2015-06-21 SURGICAL SUPPLY — 8 items
DERMABOND ADVANCED (GAUZE/BANDAGES/DRESSINGS) ×2
DERMABOND ADVANCED .7 DNX12 (GAUZE/BANDAGES/DRESSINGS) ×1 IMPLANT
PACK ANGIOGRAPHY (CUSTOM PROCEDURE TRAY) ×3 IMPLANT
PAD GROUND ADULT SPLIT (MISCELLANEOUS) ×3 IMPLANT
PENCIL ELECTRO HAND CTR (MISCELLANEOUS) ×3 IMPLANT
SUT MON AB 4-0 PC3 18 (SUTURE) ×3 IMPLANT
SUTURE VIC 3-0 (SUTURE) ×3 IMPLANT
TOWEL OR 17X26 4PK STRL BLUE (TOWEL DISPOSABLE) ×3 IMPLANT

## 2015-06-21 NOTE — Discharge Instructions (Signed)

## 2015-06-21 NOTE — H&P (Signed)
  [facility] History & Physical Update  The patient was interviewed and re-examined.  The patient's previous History and Physical has been reviewed and is unchanged.  There is no change in the plan of care. We plan to proceed with the scheduled procedure.  DEW,JASON, MD  06/21/2015, 11:40 AM

## 2015-06-21 NOTE — Op Note (Signed)
[facility]       Operative Note  Date: 06/21/2015  Preoperative diagnosis:  1. Head and neck cancer, completed chemotherapy and no longer using port  Postoperative diagnosis:  Same as above  Procedures: #1. Removal of right internal jugular port a cath   Surgeon: Leotis Pain, MD  Anesthesia: Local with moderate conscious sedation.  Fluoroscopy time: none  Contrast used: 0  Estimated blood loss: Minimal  Indication for the procedure:  The patient is a 59 y.o. male who has completed his chemotherapy for head and neck cancer and no longer needs his Port-A-Cath. The patient desires to have this removed. Risks and benefits including need for potential replacement with recurrent disease were discussed and patient is agreeable to proceed.  Description of procedure: The patient was brought to the vascular and interventional radiology suite. The right neck chest and shoulder were sterilely prepped and draped, and a sterile surgical field was created. The area was then anesthetized with 1% lidocaine copiously. The previous incision was reopened and electrocautery used to dissected down to the port and the catheter. These were dissected free and the catheter was gently removed from the vein in its entirety. The port was dissected out from the fibrous connective tissue and the Prolene sutures were removed. The port was then removed in its entirety including the catheter. The wound was then closed with a 3-0 Vicryl and a 4-0 Monocryl and Dermabond was placed as a dressing. The patient was then taken to the recovery room in stable condition having tolerated the procedure well.  Complications: none  Condition: stable   Tremell Reimers, MD 06/21/2015 12:09 PM

## 2015-06-22 ENCOUNTER — Encounter: Payer: Self-pay | Admitting: Vascular Surgery

## 2015-07-27 ENCOUNTER — Telehealth: Payer: Self-pay | Admitting: *Deleted

## 2015-07-27 NOTE — Telephone Encounter (Signed)
Left message to notify of appointment change; from 08-13-15 to 08-20-15 at 930am

## 2015-08-13 ENCOUNTER — Ambulatory Visit: Payer: Federal, State, Local not specified - PPO | Admitting: Radiation Oncology

## 2015-08-20 ENCOUNTER — Encounter: Payer: Self-pay | Admitting: Radiation Oncology

## 2015-08-20 ENCOUNTER — Ambulatory Visit
Admission: RE | Admit: 2015-08-20 | Discharge: 2015-08-20 | Disposition: A | Discharge status: Home / Self Care | Payer: Federal, State, Local not specified - PPO | Source: Ambulatory Visit | Attending: Radiation Oncology | Admitting: Radiation Oncology

## 2015-08-20 VITALS — BP 131/81 | HR 81 | Temp 97.4°F | Resp 18 | Wt 188.7 lb

## 2015-08-20 DIAGNOSIS — C099 Malignant neoplasm of tonsil, unspecified: Secondary | ICD-10-CM

## 2015-08-20 NOTE — Progress Notes (Signed)
Radiation Oncology Follow up Note  Name: Bryan Larson   Date:   08/20/2015 MRN:  [id number] DOB: 1956-02-29    This 59 y.o. 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 He is seen today in routine follow-up and is doing well. Specifically denies head and neck pain or dysphagia. Recently had a PET CT scan which shows no evidence of disease..  COMPLICATIONS OF TREATMENT: none  FOLLOW UP COMPLIANCE: keeps appointments   PHYSICAL EXAM:  BP 131/81 mmHg  Pulse 81  Temp(Src) 97.4 F (36.3 C)  Resp 18  Wt 188 lb 11.4 oz (85.6 kg) Oral cavity is clear no oral mucosal lesions are identified tonsillar region is within normal limits. Indirect mirror examination shows upper airway clear vallecula and base of tongue within normal limits. Neck is clear without evidence of subject gastric cervical or supraclavicular adenopathy. Well-developed well-nourished patient in NAD. HEENT reveals PERLA, EOMI, discs not visualized.  Oral cavity is clear. No oral mucosal lesions are identified. Neck is clear without evidence of cervical or supraclavicular adenopathy. Lungs are clear to A&P. Cardiac examination is essentially unremarkable with regular rate and rhythm without murmur rub or thrill. Abdomen is benign with no organomegaly or masses noted. Motor sensory and DTR levels are equal and symmetric in the upper and lower extremities. Cranial nerves II through XII are grossly intact. Proprioception is intact. No peripheral adenopathy or edema is  identified. No motor or sensory levels are noted. Crude visual fields are within normal range.  RADIOLOGY RESULTS: PET CT scan is reviewed and compatible with the above-stated findings  PLAN: At the present time he is doing well with no evidence of disease. I am please was overall progress. I've asked to see him back in 6 months for follow-up. I've asked him to make a follow-up appointment with Dr. Richardson Larson in ENT. Patient knows to call sooner with any concerns.  I would like to take this opportunity for allowing me to participate in the care of your patient.Bryan Larson., MD

## 2015-10-02 IMAGING — CT CT NECK WITH CONTRAST
4 series · 14 of 33 positions shown, 17 images · IV contrast (omnipaque)
Comparison: 07/17/2014.

CLINICAL DATA: Tonsillar cancer, assess for response to treatment.

EXAM:
CT NECK WITH CONTRAST
TECHNIQUE: Multidetector CT imaging of the neck was performed using the
standard protocol following the bolus administration of intravenous
contrast.
CONTRAST:  Omnipaque 350, 75 mL.

[Series 2: axial neck · axial · 0.52mm/px · z∈[+208,+390]mm · 5 of 137 slices shown, 7 images]
[im 23/137  soft-tissue]
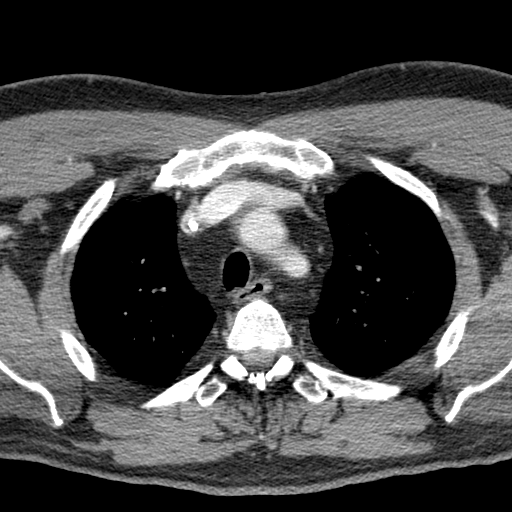
[im 23/137  bone]
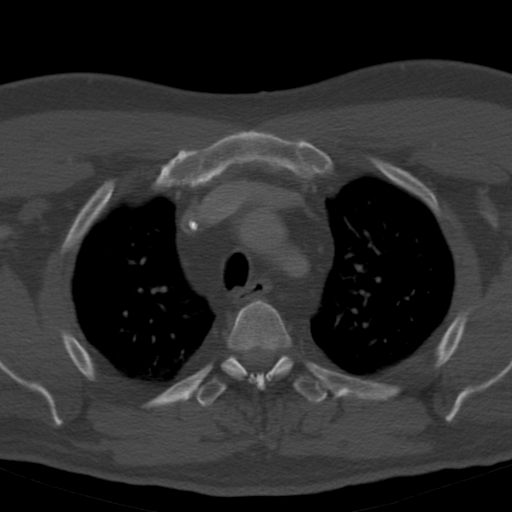
[im 46/137  bone]
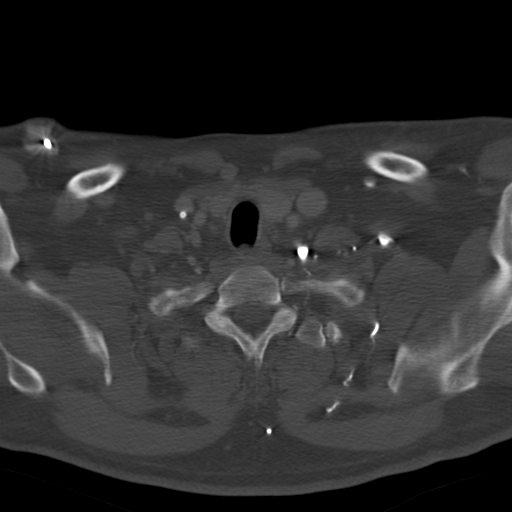
[im 69/137  bone]
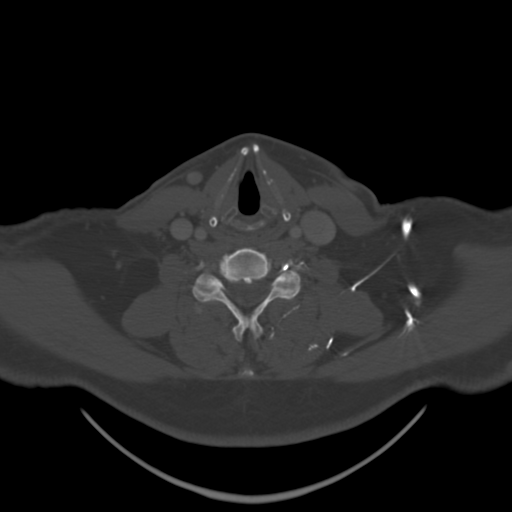
[im 91/137  bone]
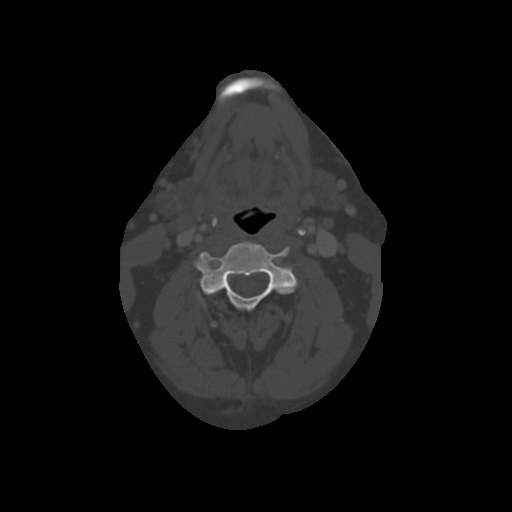
[im 114/137  soft-tissue]
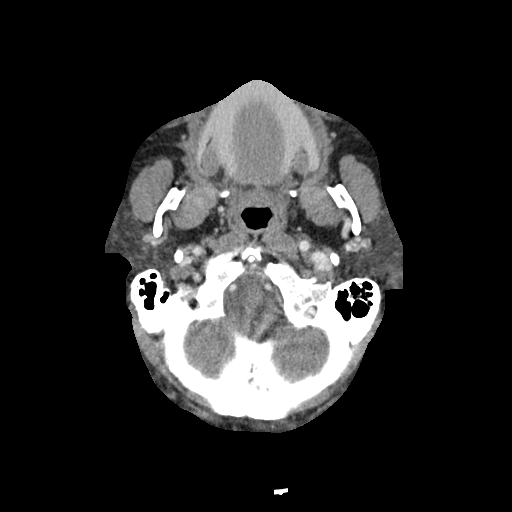
[im 114/137  bone]
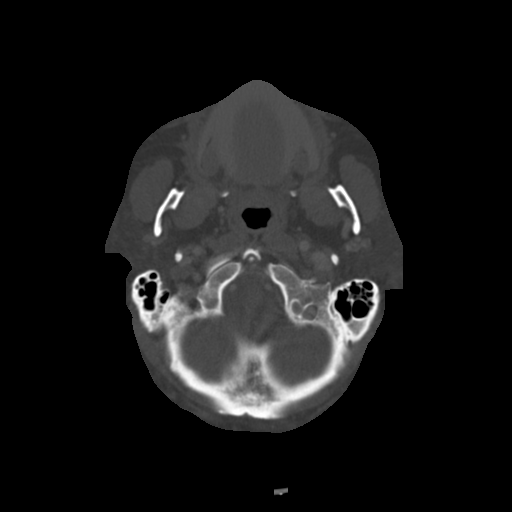

[Series 5: sag neck · sagittal · 0.44mm/px · 5 of 131 slices shown, 6 images]
[im 44/131  bone]
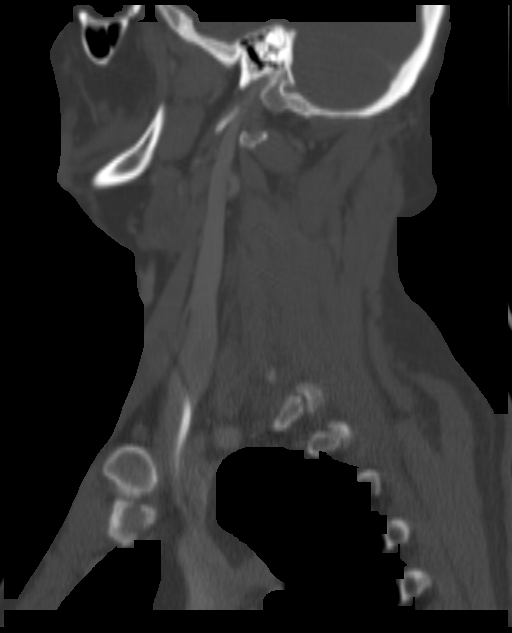
[im 55/131  bone]
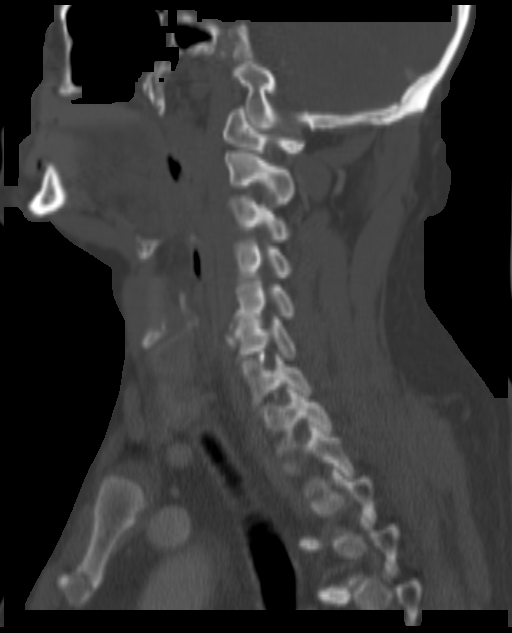
[im 66/131  soft-tissue]
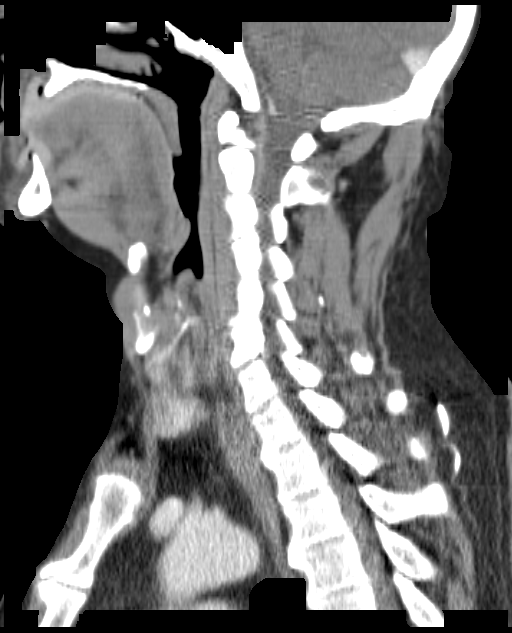
[im 66/131  bone]
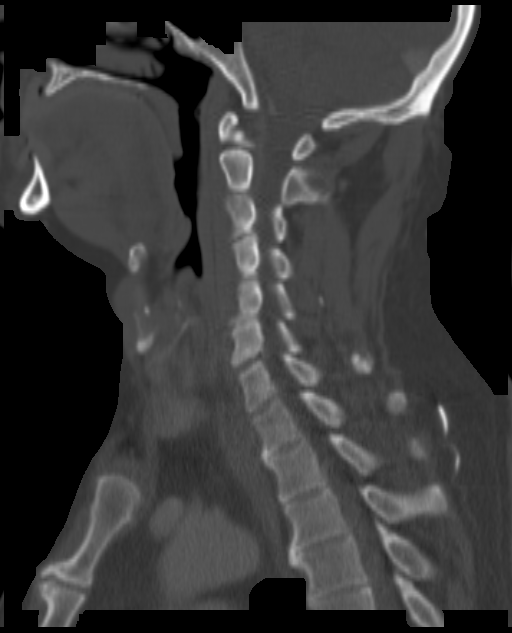
[im 76/131  bone]
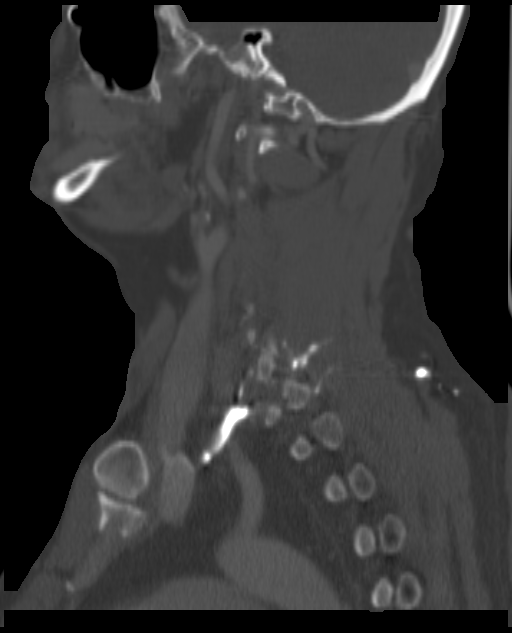
[im 87/131  bone]
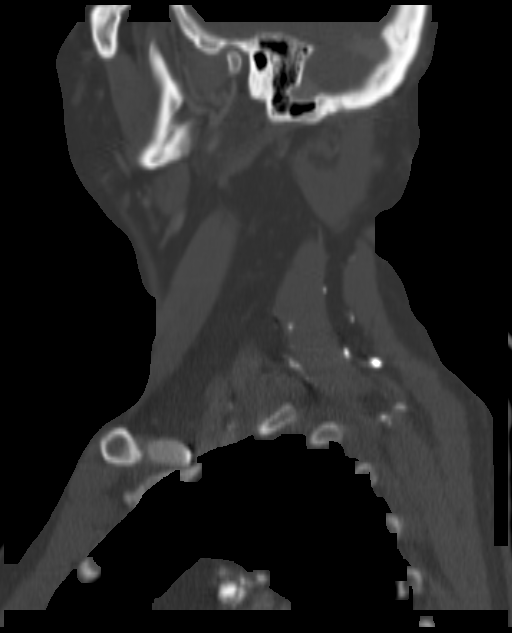

[Series 6: cor neck · coronal · 0.54mm/px · 3 of 126 slices shown]
[im 26/126  bone]
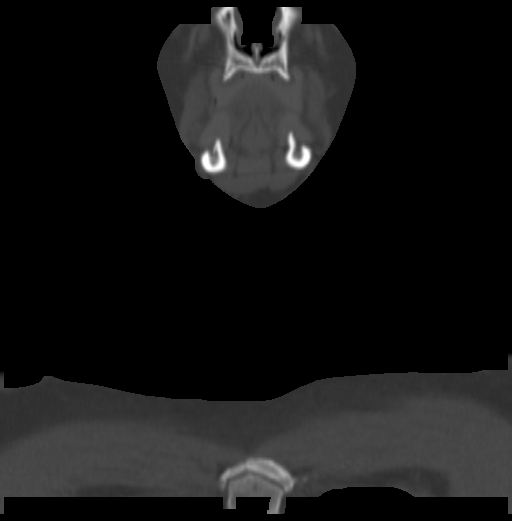
[im 51/126  bone]
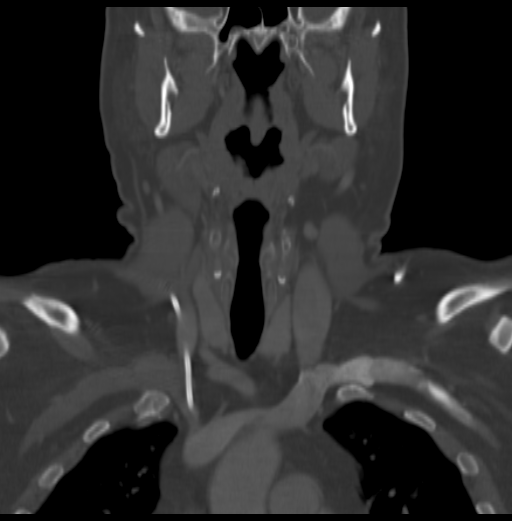
[im 76/126  bone]
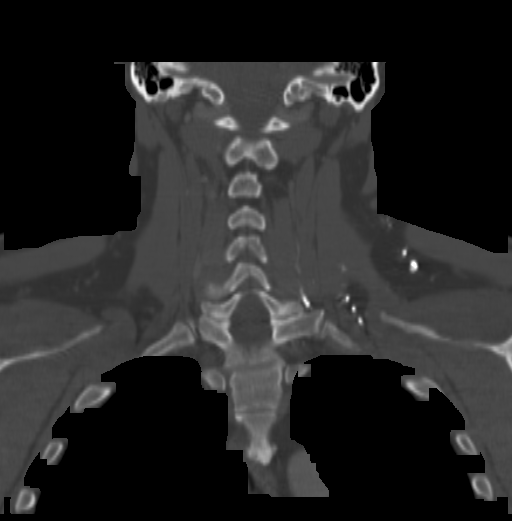

[Series 7: ax oropharynx · axial · 0.50mm/px · 1 of 135 slices shown]
[im 23/135  bone]
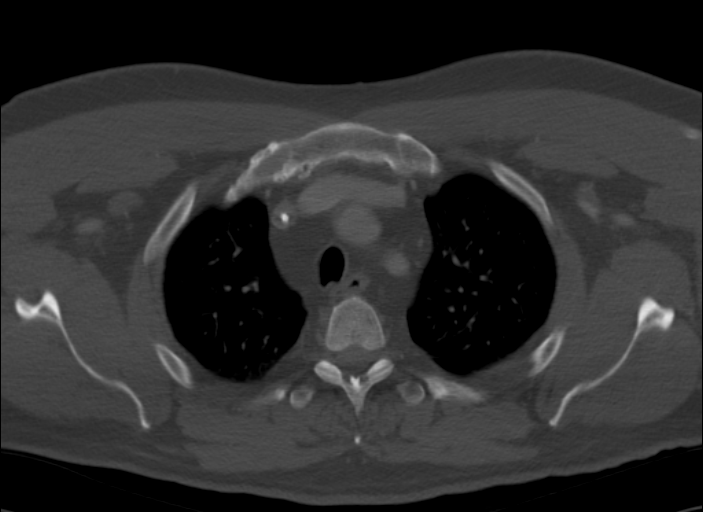

[14 of 33 positions shown; findings below may reference images not displayed]

FINDINGS: Pharynx and larynx: Near complete response to treatment of the
primary lesion in the RIGHT pharynx. No measurable tumor at this
level. Vocal cords now appear normal.

Salivary glands: Unremarkable.

Thyroid: Normal.

Lymph nodes: Significant regression of adenopathy. Representative
level III node image 63 measures 7 x 10 mm as compared to 14 x 7 mm
previously. Similar improvement in RIGHT level II nodes.

Vascular: Negative

Limited intracranial: Negative

Mastoids and visualized paranasal sinuses: Negative.

Skeleton: Negative.  Moderate spondylosis C5-C6

Upper chest: Negative. Granulomatous calcifications in the
mediastinum. Intravascular Port-A-Cath.
IMPRESSION: Near-complete response to treatment of the RIGHT tonsillar mass.
Significant improvement regional lymphadenopathy. No new lesions are
seen.

## 2015-11-19 ENCOUNTER — Ambulatory Visit: Payer: Federal, State, Local not specified - PPO | Admitting: Internal Medicine

## 2015-11-19 ENCOUNTER — Other Ambulatory Visit: Payer: Federal, State, Local not specified - PPO

## 2016-03-10 ENCOUNTER — Ambulatory Visit: Payer: Federal, State, Local not specified - PPO | Attending: Radiation Oncology | Admitting: Radiation Oncology
